# Patient Record
Sex: Male | Born: 1985 | Race: Black or African American | Hispanic: No | Marital: Single | Smoking: Current every day smoker
Health system: Southern US, Community
[De-identification: ages and names within clinical notes are randomized; demographics above are authoritative.]

## PROBLEM LIST (undated history)

## (undated) DIAGNOSIS — R569 Unspecified convulsions: Secondary | ICD-10-CM

## (undated) DIAGNOSIS — J45909 Unspecified asthma, uncomplicated: Secondary | ICD-10-CM

---
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
## 2004-01-21 ENCOUNTER — Emergency Department (HOSPITAL_COMMUNITY): Admission: EM | Admit: 2004-01-21 | Discharge: 2004-01-21 | Payer: Self-pay | Admitting: Family Medicine

## 2004-10-23 ENCOUNTER — Emergency Department (HOSPITAL_COMMUNITY): Admission: EM | Admit: 2004-10-23 | Discharge: 2004-10-23 | Payer: Self-pay | Admitting: Emergency Medicine

## 2004-10-31 ENCOUNTER — Emergency Department (HOSPITAL_COMMUNITY): Admission: EM | Admit: 2004-10-31 | Discharge: 2004-10-31 | Payer: Self-pay | Admitting: Family Medicine

## 2004-12-13 ENCOUNTER — Emergency Department (HOSPITAL_COMMUNITY): Admission: EM | Admit: 2004-12-13 | Discharge: 2004-12-13 | Payer: Self-pay | Admitting: Emergency Medicine

## 2004-12-19 ENCOUNTER — Ambulatory Visit: Payer: Self-pay | Admitting: Internal Medicine

## 2004-12-23 ENCOUNTER — Ambulatory Visit (HOSPITAL_COMMUNITY): Admission: RE | Admit: 2004-12-23 | Discharge: 2004-12-23 | Payer: Self-pay | Admitting: Internal Medicine

## 2005-02-17 ENCOUNTER — Ambulatory Visit: Payer: Self-pay | Admitting: Internal Medicine

## 2005-02-27 ENCOUNTER — Ambulatory Visit: Payer: Self-pay | Admitting: Family Medicine

## 2005-03-06 ENCOUNTER — Ambulatory Visit: Payer: Self-pay | Admitting: Internal Medicine

## 2005-03-09 ENCOUNTER — Ambulatory Visit: Payer: Self-pay | Admitting: *Deleted

## 2005-03-17 ENCOUNTER — Ambulatory Visit: Payer: Self-pay | Admitting: Internal Medicine

## 2005-08-28 ENCOUNTER — Ambulatory Visit: Payer: Self-pay | Admitting: Internal Medicine

## 2005-12-16 ENCOUNTER — Emergency Department (HOSPITAL_COMMUNITY): Admission: EM | Admit: 2005-12-16 | Discharge: 2005-12-17 | Payer: Self-pay | Admitting: Emergency Medicine

## 2006-04-27 ENCOUNTER — Emergency Department (HOSPITAL_COMMUNITY): Admission: EM | Admit: 2006-04-27 | Discharge: 2006-04-27 | Payer: Self-pay | Admitting: Emergency Medicine

## 2007-08-14 ENCOUNTER — Emergency Department (HOSPITAL_COMMUNITY): Admission: EM | Admit: 2007-08-14 | Discharge: 2007-08-14 | Payer: Self-pay | Admitting: Emergency Medicine

## 2007-08-22 ENCOUNTER — Ambulatory Visit: Payer: Self-pay | Admitting: Nurse Practitioner

## 2007-08-22 DIAGNOSIS — G40909 Epilepsy, unspecified, not intractable, without status epilepticus: Secondary | ICD-10-CM

## 2007-09-05 ENCOUNTER — Encounter (INDEPENDENT_AMBULATORY_CARE_PROVIDER_SITE_OTHER): Payer: Self-pay | Admitting: Nurse Practitioner

## 2007-09-06 ENCOUNTER — Ambulatory Visit: Payer: Self-pay | Admitting: Pulmonary Disease

## 2007-09-08 ENCOUNTER — Ambulatory Visit: Payer: Self-pay | Admitting: Nurse Practitioner

## 2007-09-14 LAB — CONVERTED CEMR LAB
ALT: 64 units/L — ABNORMAL HIGH (ref 0–53)
Albumin: 4.7 g/dL (ref 3.5–5.2)
Alkaline Phosphatase: 135 units/L — ABNORMAL HIGH (ref 39–117)
Basophils Absolute: 0 10*3/uL (ref 0.0–0.1)
Eosinophils Absolute: 0.4 10*3/uL (ref 0.0–0.7)
Glucose, Bld: 84 mg/dL (ref 70–99)
Lymphocytes Relative: 30 % (ref 12–46)
Lymphs Abs: 1.7 10*3/uL (ref 0.7–4.0)
MCV: 90 fL (ref 78.0–100.0)
Neutrophils Relative %: 53 % (ref 43–77)
Platelets: 383 10*3/uL (ref 150–400)
Potassium: 4.5 meq/L (ref 3.5–5.3)
RDW: 13 % (ref 11.5–15.5)
Sodium: 139 meq/L (ref 135–145)
Total Protein: 8.2 g/dL (ref 6.0–8.3)
WBC: 5.9 10*3/uL (ref 4.0–10.5)

## 2007-09-15 ENCOUNTER — Encounter (INDEPENDENT_AMBULATORY_CARE_PROVIDER_SITE_OTHER): Payer: Self-pay | Admitting: Nurse Practitioner

## 2007-09-22 ENCOUNTER — Telehealth (INDEPENDENT_AMBULATORY_CARE_PROVIDER_SITE_OTHER): Payer: Self-pay | Admitting: Nurse Practitioner

## 2007-09-23 ENCOUNTER — Telehealth (INDEPENDENT_AMBULATORY_CARE_PROVIDER_SITE_OTHER): Payer: Self-pay | Admitting: Nurse Practitioner

## 2007-09-27 ENCOUNTER — Ambulatory Visit: Payer: Self-pay | Admitting: Nurse Practitioner

## 2007-09-28 LAB — CONVERTED CEMR LAB: Phenytoin Lvl: 10.3 ug/mL (ref 10.0–20.0)

## 2007-09-29 ENCOUNTER — Encounter (INDEPENDENT_AMBULATORY_CARE_PROVIDER_SITE_OTHER): Payer: Self-pay | Admitting: Nurse Practitioner

## 2007-11-17 ENCOUNTER — Emergency Department (HOSPITAL_COMMUNITY): Admission: EM | Admit: 2007-11-17 | Discharge: 2007-11-17 | Payer: Self-pay | Admitting: Emergency Medicine

## 2007-12-27 ENCOUNTER — Encounter (INDEPENDENT_AMBULATORY_CARE_PROVIDER_SITE_OTHER): Payer: Self-pay | Admitting: Nurse Practitioner

## 2008-01-16 ENCOUNTER — Ambulatory Visit: Payer: Self-pay | Admitting: Family Medicine

## 2008-01-16 DIAGNOSIS — L738 Other specified follicular disorders: Secondary | ICD-10-CM | POA: Insufficient documentation

## 2008-02-10 ENCOUNTER — Ambulatory Visit: Payer: Self-pay | Admitting: Nurse Practitioner

## 2008-02-13 ENCOUNTER — Encounter (INDEPENDENT_AMBULATORY_CARE_PROVIDER_SITE_OTHER): Payer: Self-pay | Admitting: Nurse Practitioner

## 2008-07-03 ENCOUNTER — Encounter (INDEPENDENT_AMBULATORY_CARE_PROVIDER_SITE_OTHER): Payer: Self-pay | Admitting: Family Medicine

## 2008-07-03 ENCOUNTER — Ambulatory Visit: Payer: Self-pay | Admitting: Nurse Practitioner

## 2008-07-03 DIAGNOSIS — J45909 Unspecified asthma, uncomplicated: Secondary | ICD-10-CM | POA: Insufficient documentation

## 2010-04-15 ENCOUNTER — Emergency Department (HOSPITAL_COMMUNITY): Admission: EM | Admit: 2010-04-15 | Discharge: 2010-04-15 | Payer: Self-pay | Admitting: Emergency Medicine

## 2010-04-16 ENCOUNTER — Emergency Department (HOSPITAL_COMMUNITY)
Admission: EM | Admit: 2010-04-16 | Discharge: 2010-04-16 | Payer: Self-pay | Source: Home / Self Care | Admitting: Family Medicine

## 2011-02-23 LAB — RAPID URINE DRUG SCREEN, HOSP PERFORMED
Amphetamines: NOT DETECTED
Barbiturates: NOT DETECTED
Benzodiazepines: NOT DETECTED
Cocaine: NOT DETECTED
Opiates: NOT DETECTED
Tetrahydrocannabinol: NOT DETECTED

## 2011-02-26 LAB — PHENYTOIN LEVEL, TOTAL: Phenytoin Lvl: <2.5 — ABNORMAL LOW

## 2011-05-18 ENCOUNTER — Emergency Department (HOSPITAL_COMMUNITY)
Admission: EM | Admit: 2011-05-18 | Discharge: 2011-05-19 | Discharge status: Against Medical Advice | Payer: BC Managed Care – PPO | Attending: Emergency Medicine | Admitting: Emergency Medicine

## 2011-05-18 ENCOUNTER — Encounter: Payer: Self-pay | Admitting: *Deleted

## 2011-05-18 DIAGNOSIS — M79609 Pain in unspecified limb: Secondary | ICD-10-CM | POA: Insufficient documentation

## 2011-05-18 DIAGNOSIS — R109 Unspecified abdominal pain: Secondary | ICD-10-CM | POA: Insufficient documentation

## 2011-05-18 DIAGNOSIS — R42 Dizziness and giddiness: Secondary | ICD-10-CM | POA: Insufficient documentation

## 2011-05-18 HISTORY — DX: Unspecified convulsions: R56.9

## 2011-05-18 NOTE — ED Notes (Addendum)
Dizzy at work today, comes and goes, abd pain onset tonight, comes and goes, "like a knot", describes as intermittant and severe, (denies: nvd, fever or other sx), rates pain now at 4/10. occurrs every 30 minutes, then just stops. Legs have been hurting today also.

## 2011-05-19 LAB — DIFFERENTIAL
Basophils Absolute: 0 10*3/uL (ref 0.0–0.1)
Basophils Relative: 1 % (ref 0–1)
Lymphocytes Relative: 26 % (ref 12–46)
Neutro Abs: 5.3 10*3/uL (ref 1.7–7.7)

## 2011-05-19 LAB — CBC
MCHC: 34 g/dL (ref 30.0–36.0)
Platelets: 336 10*3/uL (ref 150–400)
RDW: 12.8 % (ref 11.5–15.5)
WBC: 8.9 10*3/uL (ref 4.0–10.5)

## 2011-05-19 LAB — BASIC METABOLIC PANEL
BUN: 12 mg/dL (ref 6–23)
Calcium: 9.4 mg/dL (ref 8.4–10.5)
Chloride: 107 mEq/L (ref 96–112)
Creatinine, Ser: 0.78 mg/dL (ref 0.50–1.35)
GFR calc Af Amer: >90 mL/min (ref >90)
GFR calc non Af Amer: >90 mL/min (ref >90)

## 2011-05-19 NOTE — ED Notes (Signed)
Called patient 3 times and NO ANSWER. 

## 2011-05-22 ENCOUNTER — Emergency Department (HOSPITAL_COMMUNITY)
Admission: EM | Admit: 2011-05-22 | Discharge: 2011-05-23 | Disposition: A | Discharge status: Home / Self Care | Payer: BC Managed Care – PPO | Attending: Emergency Medicine | Admitting: Emergency Medicine

## 2011-05-22 ENCOUNTER — Encounter (HOSPITAL_COMMUNITY): Payer: Self-pay | Admitting: *Deleted

## 2011-05-22 DIAGNOSIS — A084 Viral intestinal infection, unspecified: Secondary | ICD-10-CM

## 2011-05-22 DIAGNOSIS — G40909 Epilepsy, unspecified, not intractable, without status epilepticus: Secondary | ICD-10-CM | POA: Insufficient documentation

## 2011-05-22 DIAGNOSIS — A088 Other specified intestinal infections: Secondary | ICD-10-CM | POA: Insufficient documentation

## 2011-05-22 DIAGNOSIS — R197 Diarrhea, unspecified: Secondary | ICD-10-CM | POA: Insufficient documentation

## 2011-05-22 DIAGNOSIS — R110 Nausea: Secondary | ICD-10-CM | POA: Insufficient documentation

## 2011-05-22 DIAGNOSIS — R1033 Periumbilical pain: Secondary | ICD-10-CM | POA: Insufficient documentation

## 2011-05-22 LAB — COMPREHENSIVE METABOLIC PANEL
AST: 19 U/L (ref 0–37)
Albumin: 4.1 g/dL (ref 3.5–5.2)
Alkaline Phosphatase: 103 U/L (ref 39–117)
CO2: 23 mEq/L (ref 19–32)
Chloride: 101 mEq/L (ref 96–112)
Creatinine, Ser: 0.83 mg/dL (ref 0.50–1.35)
GFR calc non Af Amer: >90 mL/min (ref >90)
Potassium: 3.9 mEq/L (ref 3.5–5.1)
Total Bilirubin: 0.3 mg/dL (ref 0.3–1.2)

## 2011-05-22 LAB — DIFFERENTIAL
Basophils Absolute: 0.1 10*3/uL (ref 0.0–0.1)
Basophils Relative: 1 % (ref 0–1)
Lymphocytes Relative: 27 % (ref 12–46)
Monocytes Absolute: 0.8 10*3/uL (ref 0.1–1.0)
Monocytes Relative: 9 % (ref 3–12)
Neutro Abs: 5.1 10*3/uL (ref 1.7–7.7)
Neutrophils Relative %: 54 % (ref 43–77)

## 2011-05-22 LAB — CBC
HCT: 46 % (ref 39.0–52.0)
Hemoglobin: 16.1 g/dL (ref 13.0–17.0)
MCHC: 35 g/dL (ref 30.0–36.0)
RDW: 12.5 % (ref 11.5–15.5)
WBC: 9.4 10*3/uL (ref 4.0–10.5)

## 2011-05-22 MED ORDER — OXYCODONE-ACETAMINOPHEN 5-325 MG PO TABS
2.0000 | ORAL_TABLET | Freq: Once | ORAL | Status: AC
  Administered 2011-05-22: 2 via ORAL
  Filled 2011-05-22: qty 2

## 2011-05-22 MED ORDER — GI COCKTAIL ~~LOC~~
30.0000 mL | Freq: Once | ORAL | Status: AC
  Administered 2011-05-22: 30 mL via ORAL
  Filled 2011-05-22: qty 30

## 2011-05-22 MED ORDER — ONDANSETRON 4 MG PO TBDP
8.0000 mg | ORAL_TABLET | Freq: Once | ORAL | Status: AC
  Administered 2011-05-22: 8 mg via ORAL
  Filled 2011-05-22: qty 2

## 2011-05-22 NOTE — ED Notes (Addendum)
C/o abd pain, was here 2d ago, eloped at that time. "felt fine yesterday, but pain came back today". Took pepto bismol and "something else" ("not sure what it was, promethazine or something like that"), rates pain 5/10. Pinpoints pain to above umbilicus. Also some nausea & diarrhea, (denies: vomiting or fever), last BM 1 hr ago (diarrhea/water & soft). Labs on the 18th reviewed/unremarkable.

## 2011-05-23 LAB — URINALYSIS, ROUTINE W REFLEX MICROSCOPIC
Bilirubin Urine: NEGATIVE
Glucose, UA: NEGATIVE mg/dL
Hgb urine dipstick: NEGATIVE
Ketones, ur: NEGATIVE mg/dL
Protein, ur: NEGATIVE mg/dL
pH: 6 (ref 5.0–8.0)

## 2011-05-23 MED ORDER — ONDANSETRON 8 MG PO TBDP: 8.0000 mg | ORAL_TABLET | Freq: Three times a day (TID) | ORAL | Status: AC | PRN

## 2011-05-23 MED ORDER — OXYCODONE-ACETAMINOPHEN 5-325 MG PO TABS: 2.0000 | ORAL_TABLET | ORAL | Status: AC | PRN

## 2011-05-23 NOTE — ED Notes (Signed)
Rx given x2 D/c instructions reviewed w/ pt - pt denies any further questions or concerns at present.   

## 2011-05-23 NOTE — Progress Notes (Signed)
Patient was received from Dr. Doylene Canard with disposition pending urinalysis. Urinalysis has come back normal and the patient is discharged with instructions that if the left by Dr. Doylene Canard.

## 2011-06-03 NOTE — ED Provider Notes (Signed)
History     CSN: [id number]  Arrival date & time 05/22/11  4098   First MD Initiated Contact with Patient 05/22/11 2215      Chief Complaint  Patient presents with  . Abdominal Pain    (Consider location/radiation/quality/duration/timing/severity/associated sxs/prior treatment) Patient is a 26 y.o. male presenting with abdominal pain. The history is provided by the patient.  Abdominal Pain The primary symptoms of the illness include abdominal pain, nausea and diarrhea. The primary symptoms of the illness do not include fever, fatigue, shortness of breath, vomiting, hematemesis, hematochezia or dysuria. The current episode started more than 2 days ago. The onset of the illness was gradual. Progression since onset: Waxing and waning, worsening.  The abdominal pain began more than 2 days ago. The pain came on gradually. Progression: Waxing and waning, worsening. The abdominal pain is located in the periumbilical region. The abdominal pain does not radiate. The severity of the abdominal pain is 5/10. The abdominal pain is relieved by nothing. The abdominal pain is exacerbated by eating.  Symptoms associated with the illness do not include chills, anorexia, diaphoresis, heartburn, constipation, urgency, hematuria, frequency or back pain.    Past Medical History  Diagnosis Date  . Seizures     History reviewed. No pertinent past surgical history.  History reviewed. No pertinent family history.  History  Substance Use Topics  . Smoking status: Current Everyday Smoker -- 1.0 packs/day  . Smokeless tobacco: Not on file  . Alcohol Use: Yes      Review of Systems  Unable to perform ROS Constitutional: Positive for appetite change. Negative for fever, chills, diaphoresis, activity change, fatigue and unexpected weight change.  HENT: Negative for ear pain, congestion, sore throat, rhinorrhea, mouth sores, trouble swallowing, neck pain, neck stiffness and postnasal drip.   Eyes:  Negative.   Respiratory: Negative for cough, chest tightness, shortness of breath and wheezing.   Cardiovascular: Negative for chest pain, palpitations and leg swelling.  Gastrointestinal: Positive for nausea, abdominal pain and diarrhea. Negative for heartburn, vomiting, constipation, blood in stool, hematochezia, abdominal distention, anal bleeding, rectal pain, anorexia and hematemesis.  Genitourinary: Negative for dysuria, urgency, frequency, hematuria and flank pain.  Musculoskeletal: Negative for myalgias, back pain and arthralgias.  Skin: Negative for color change, pallor, rash and wound.  Neurological: Negative for dizziness, syncope, weakness, light-headedness and headaches.  Hematological: Negative for adenopathy.  Psychiatric/Behavioral: Negative.     Allergies  Chocolate flavor; Eggs or egg-derived products; and Peanut-containing drug products  Home Medications   Current Outpatient Rx  Name Route Sig Dispense Refill  . BISMUTH SUBSALICYLATE 262 MG/15ML PO SUSP Oral Take 30 mLs by mouth every 6 (six) hours as needed. For upset stomach     . PROCHLORPERAZINE MALEATE 5 MG PO TABS Oral Take 5 mg by mouth every 6 (six) hours as needed. For nausea     . ALBUTEROL SULFATE HFA 108 (90 BASE) MCG/ACT IN AERS Inhalation Inhale 2 puffs into the lungs every 6 (six) hours as needed. For shortness of breath     . OXYCODONE-ACETAMINOPHEN 5-325 MG PO TABS Oral Take 2 tablets by mouth every 4 (four) hours as needed for pain. 20 tablet 0    BP 123/82  Pulse 80  Temp(Src) 97.2 F (36.2 C) (Oral)  Resp 16  SpO2 97%  Physical Exam  Nursing note and vitals reviewed. Constitutional: He is oriented to person, place, and time. He appears well-developed and well-nourished. He is active.  Non-toxic appearance. He does not  have a sickly appearance. He does not appear ill. He appears distressed.  HENT:  Head: Normocephalic and atraumatic.  Right Ear: Hearing, tympanic membrane, external ear and  ear canal normal.  Left Ear: Hearing, tympanic membrane, external ear and ear canal normal.  Nose: Nose normal. No mucosal edema.  Mouth/Throat: Uvula is midline and oropharynx is clear and moist. Mucous membranes are dry. No oral lesions. No uvula swelling. No oropharyngeal exudate, posterior oropharyngeal edema, posterior oropharyngeal erythema or tonsillar abscesses.  Eyes: Conjunctivae and EOM are normal. Pupils are equal, round, and reactive to light. Right eye exhibits no chemosis, no discharge and no exudate. Left eye exhibits no chemosis, no discharge and no exudate. Right conjunctiva is not injected. Left conjunctiva is not injected. No scleral icterus.  Neck: Normal range of motion, full passive range of motion without pain and phonation normal. Neck supple. No rigidity. No Brudzinski's sign noted.  Cardiovascular: Normal rate, regular rhythm, intact distal pulses and normal pulses.   No extrasystoles are present.  Pulmonary/Chest: Effort normal and breath sounds normal. No accessory muscle usage. Not tachypneic. No respiratory distress. He has no decreased breath sounds. He has no wheezes. He has no rhonchi. He has no rales. He exhibits no tenderness, no crepitus and no retraction.  Abdominal: Soft. Normal appearance. He exhibits no shifting dullness, no distension, no pulsatile liver, no fluid wave, no abdominal bruit, no ascites, no pulsatile midline mass and no mass. Bowel sounds are increased. There is no hepatosplenomegaly. There is generalized tenderness. There is no rigidity, no rebound and no guarding. No hernia.  Musculoskeletal: Normal range of motion.  Neurological: He is alert and oriented to person, place, and time. He has normal strength and normal reflexes. He is not disoriented. No cranial nerve deficit. Coordination normal. GCS eye subscore is 4. GCS verbal subscore is 5. GCS motor subscore is 6.  Skin: Skin is warm, dry and intact. No bruising, no ecchymosis, no lesion and no  rash noted. He is not diaphoretic. No erythema. No pallor.  Psychiatric: He has a normal mood and affect. His speech is normal and behavior is normal. Judgment and thought content normal. Cognition and memory are normal.    ED Course  Procedures (including critical care time)  Labs Reviewed  DIFFERENTIAL - Abnormal; Notable for the following:    Eosinophils Relative 10 (*)    Eosinophils Absolute 1.0 (*)    All other components within normal limits  CBC  COMPREHENSIVE METABOLIC PANEL  LIPASE, BLOOD  URINALYSIS, ROUTINE W REFLEX MICROSCOPIC  LAB REPORT - SCANNED   No results found.   1. Abdominal pain   2. Nausea   3. Diarrhea   4. Viral gastroenteritis       MDM  Gastritis, peptic ulcer disease, biliary colic, cholelithiasis, cholecystitis, cholangitis, hepatitis, renal colic, urinary tract infection, colitis, constipation, gastroenteritis all considered among other etiologies in the patient's differential diagnosis.         Felisa Bonier, MD 06/03/11 [phone]

## 2011-07-10 ENCOUNTER — Emergency Department (HOSPITAL_COMMUNITY)
Admission: EM | Admit: 2011-07-10 | Discharge: 2011-07-11 | Disposition: A | Discharge status: Home / Self Care | Payer: BC Managed Care – PPO | Attending: Emergency Medicine | Admitting: Emergency Medicine

## 2011-07-10 DIAGNOSIS — S0181XA Laceration without foreign body of other part of head, initial encounter: Secondary | ICD-10-CM

## 2011-07-10 DIAGNOSIS — S0180XA Unspecified open wound of other part of head, initial encounter: Secondary | ICD-10-CM | POA: Insufficient documentation

## 2011-07-10 DIAGNOSIS — Y92410 Unspecified street and highway as the place of occurrence of the external cause: Secondary | ICD-10-CM | POA: Insufficient documentation

## 2011-07-10 DIAGNOSIS — J45909 Unspecified asthma, uncomplicated: Secondary | ICD-10-CM | POA: Insufficient documentation

## 2011-07-10 DIAGNOSIS — Y999 Unspecified external cause status: Secondary | ICD-10-CM | POA: Insufficient documentation

## 2011-07-10 DIAGNOSIS — Z79899 Other long term (current) drug therapy: Secondary | ICD-10-CM | POA: Insufficient documentation

## 2011-07-10 DIAGNOSIS — S0083XA Contusion of other part of head, initial encounter: Secondary | ICD-10-CM

## 2011-07-10 DIAGNOSIS — F10929 Alcohol use, unspecified with intoxication, unspecified: Secondary | ICD-10-CM

## 2011-07-10 DIAGNOSIS — F17200 Nicotine dependence, unspecified, uncomplicated: Secondary | ICD-10-CM | POA: Insufficient documentation

## 2011-07-11 ENCOUNTER — Emergency Department (HOSPITAL_COMMUNITY): Payer: BC Managed Care – PPO

## 2011-07-11 ENCOUNTER — Encounter (HOSPITAL_COMMUNITY): Payer: Self-pay | Admitting: *Deleted

## 2011-07-11 LAB — URINALYSIS, MICROSCOPIC ONLY
Glucose, UA: NEGATIVE mg/dL
Specific Gravity, Urine: 1.007 (ref 1.005–1.030)
Urobilinogen, UA: 0.2 mg/dL (ref 0.0–1.0)
pH: 6 (ref 5.0–8.0)

## 2011-07-11 LAB — COMPREHENSIVE METABOLIC PANEL
ALT: 56 U/L — ABNORMAL HIGH (ref 0–53)
AST: 62 U/L — ABNORMAL HIGH (ref 0–37)
Alkaline Phosphatase: 102 U/L (ref 39–117)
CO2: 20 mEq/L (ref 19–32)
Chloride: 103 mEq/L (ref 96–112)
GFR calc non Af Amer: >90 mL/min (ref >90)
Potassium: 3.8 mEq/L (ref 3.5–5.1)
Sodium: 137 mEq/L (ref 135–145)
Total Bilirubin: 0.2 mg/dL — ABNORMAL LOW (ref 0.3–1.2)

## 2011-07-11 LAB — CBC
HCT: 44.1 % (ref 39.0–52.0)
Hemoglobin: 15.5 g/dL (ref 13.0–17.0)
MCV: 88.2 fL (ref 78.0–100.0)
RDW: 12.7 % (ref 11.5–15.5)

## 2011-07-11 LAB — LACTIC ACID, PLASMA: Lactic Acid, Venous: 1.7 mmol/L (ref 0.5–2.2)

## 2011-07-11 LAB — POCT I-STAT, CHEM 8
BUN: 13 mg/dL (ref 6–23)
Chloride: 110 mEq/L (ref 96–112)
Glucose, Bld: 99 mg/dL (ref 70–99)
HCT: 47 % (ref 39.0–52.0)
Potassium: 3.8 mEq/L (ref 3.5–5.1)

## 2011-07-11 LAB — SAMPLE TO BLOOD BANK

## 2011-07-11 LAB — ETHANOL: Alcohol, Ethyl (B): 169 mg/dL — ABNORMAL HIGH (ref 0–11)

## 2011-07-11 MED ORDER — SODIUM CHLORIDE 0.9 % IV BOLUS (SEPSIS): 1000.0000 mL | Freq: Once | INTRAVENOUS | Status: DC

## 2011-07-11 MED ORDER — BACITRACIN 500 UNIT/GM EX OINT
1.0000 "application " | TOPICAL_OINTMENT | CUTANEOUS | Status: AC
  Administered 2011-07-11: 1 via TOPICAL
  Filled 2011-07-11: qty 0.9

## 2011-07-11 MED ORDER — TETANUS-DIPHTH-ACELL PERTUSSIS 5-2.5-18.5 LF-MCG/0.5 IM SUSP
0.5000 mL | Freq: Once | INTRAMUSCULAR | Status: AC
  Administered 2011-07-11: 0.5 mL via INTRAMUSCULAR
  Filled 2011-07-11: qty 0.5

## 2011-07-11 MED ORDER — HYDROCODONE-ACETAMINOPHEN 5-325 MG PO TABS: 1.0000 | ORAL_TABLET | Freq: Four times a day (QID) | ORAL | Status: AC | PRN
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
## 2011-07-11 NOTE — ED Notes (Addendum)
LS CTA, MAEx4, abd soft NT, no crepitus, CMS intact, polite, cooperative, follows commands, smells of ETOH. H/o sz, unsure of last sz.
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
## 2011-07-19 ENCOUNTER — Encounter (HOSPITAL_COMMUNITY): Payer: Self-pay | Admitting: *Deleted

## 2011-07-19 ENCOUNTER — Emergency Department (HOSPITAL_COMMUNITY)
Admission: EM | Admit: 2011-07-19 | Discharge: 2011-07-19 | Disposition: A | Discharge status: Home / Self Care | Payer: BC Managed Care – PPO | Attending: Emergency Medicine | Admitting: Emergency Medicine

## 2011-07-19 DIAGNOSIS — Z4802 Encounter for removal of sutures: Secondary | ICD-10-CM | POA: Insufficient documentation

## 2011-07-19 NOTE — ED Notes (Signed)
Here for suture removal, several stitches noted to bilateral eyebrows, no other complaints.

## 2011-07-19 NOTE — ED Provider Notes (Signed)
History     CSN: [id number]  Arrival date & time 07/19/11  1327   First MD Initiated Contact with Patient 07/19/11 1350      Chief Complaint  Patient presents with  . Wound Check    (Consider location/radiation/quality/duration/timing/severity/associated sxs/prior treatment) HPI  Past Medical History  Diagnosis Date  . Seizures     History reviewed. No pertinent past surgical history.  History reviewed. No pertinent family history.  History  Substance Use Topics  . Smoking status: Current Everyday Smoker -- 1.0 packs/day  . Smokeless tobacco: Not on file  . Alcohol Use: Yes      Review of Systems  All other systems reviewed and are negative.    Allergies  Chocolate flavor; Eggs or egg-derived products; and Peanut-containing drug products  Home Medications   Current Outpatient Rx  Name Route Sig Dispense Refill  . HYDROCODONE-ACETAMINOPHEN 5-325 MG PO TABS Oral Take 1-2 tablets by mouth every 6 (six) hours as needed for pain. 20 tablet 0  . ALBUTEROL SULFATE HFA 108 (90 BASE) MCG/ACT IN AERS Inhalation Inhale 2 puffs into the lungs every 6 (six) hours as needed. For shortness of breath       BP 121/78  Pulse 89  Temp(Src) 98.5 F (36.9 C) (Oral)  Resp 18  SpO2 100%  Physical Exam  Nursing note and vitals reviewed. Constitutional: He is oriented to person, place, and time. He appears well-developed and well-nourished.  HENT:  Head: Normocephalic.       Sutures in place and bilateral eyebrows as well as central forehead.  There is no fluctuance or erythema.  There is no drainage from the wounds.  The suture line is intact.  The scar is forming  Eyes: EOM are normal.  Neck: Normal range of motion.  Pulmonary/Chest: Effort normal.  Musculoskeletal: Normal range of motion.  Neurological: He is alert and oriented to person, place, and time.  Psychiatric: He has a normal mood and affect.    ED Course  Procedures (including critical care time)  SUTURE  REMOVAL Performed by: Lyanne Co  Consent: Verbal consent obtained. Patient identity confirmed: provided demographic data Time out: Immediately prior to procedure a "time out" was called to verify the correct patient, procedure, equipment, support staff and site/side marked as required.  Location: Left eyebrow right eyebrow and forehead  Wound Appearance: clean  Sutures/Staples Removed: 6 simple interrupted and a running   Patient tolerance: Patient tolerated the procedure well with no immediate complications.     Labs Reviewed - No data to display No results found.   1. Visit for suture removal       MDM  Suture removal.  No infection the        Lyanne Co, MD 07/19/11 1404

## 2011-12-22 ENCOUNTER — Emergency Department (INDEPENDENT_AMBULATORY_CARE_PROVIDER_SITE_OTHER)
Admission: EM | Admit: 2011-12-22 | Discharge: 2011-12-22 | Disposition: A | Discharge status: Home / Self Care | Payer: BC Managed Care – PPO | Source: Home / Self Care | Attending: Emergency Medicine | Admitting: Emergency Medicine

## 2011-12-22 ENCOUNTER — Encounter (HOSPITAL_COMMUNITY): Payer: Self-pay

## 2011-12-22 DIAGNOSIS — L259 Unspecified contact dermatitis, unspecified cause: Secondary | ICD-10-CM

## 2011-12-22 DIAGNOSIS — F524 Premature ejaculation: Secondary | ICD-10-CM

## 2011-12-22 HISTORY — DX: Unspecified asthma, uncomplicated: J45.909

## 2011-12-22 MED ORDER — HYDROXYZINE HCL 25 MG PO TABS: 25.0000 mg | ORAL_TABLET | Freq: Four times a day (QID) | ORAL | Status: AC | PRN

## 2011-12-22 MED ORDER — PREDNISONE 10 MG PO TABS: 50.0000 mg | ORAL_TABLET | Freq: Every day | ORAL | Status: DC

## 2011-12-22 NOTE — ED Provider Notes (Signed)
Medical screening examination/treatment/procedure(s) were performed by non-physician practitioner and as supervising physician I was immediately available for consultation/collaboration.  Leslee Home, M.D.   Reuben Likes, MD 12/22/11 2056

## 2011-12-22 NOTE — ED Notes (Signed)
C/o itchy rash to arms, legs, face and neck for 1 week.  States children in his home had the same thing.  Has taken benadryl without relief of sx.  Also c/o problems with premature ejaculation for several months- states has never had this problem before.

## 2011-12-22 NOTE — ED Provider Notes (Signed)
History     CSN: [id number]  Arrival date & time 12/22/11  1627   First MD Initiated Contact with Patient 12/22/11 1811      Chief Complaint  Patient presents with  . Rash  . Premature Ejaculation    (Consider location/radiation/quality/duration/timing/severity/associated sxs/prior treatment) HPI Comments: Pt also c/o early ejaculation for 9 months, so rapid that time from erection to ejaculation is a minute or 2.  Denies increased stress, mental health concerns, no new medicines.   Patient is a 26 y.o. male presenting with rash. The history is provided by the patient.  Rash  This is a new problem. Episode onset: a week ago. The problem has been gradually worsening. The problem is associated with a new detergent/soap. There has been no fever. The rash is present on the face, groin, back, neck, left arm, right arm, right upper leg and left upper leg. The pain is at a severity of 0/10. Associated symptoms include itching. He has tried antihistamines for the symptoms. The treatment provided no relief.    Past Medical History  Diagnosis Date  . Seizures   . Asthma     History reviewed. No pertinent past surgical history.  History reviewed. No pertinent family history.  History  Substance Use Topics  . Smoking status: Current Everyday Smoker -- 1.0 packs/day  . Smokeless tobacco: Not on file  . Alcohol Use: Yes      Review of Systems  Constitutional: Negative for fever and chills.  Genitourinary:       Early ejaculation  Skin: Positive for itching and rash.    Allergies  Chocolate flavor; Eggs or egg-derived products; and Peanut-containing drug products  Home Medications   Current Outpatient Rx  Name Route Sig Dispense Refill  . ALBUTEROL SULFATE HFA 108 (90 BASE) MCG/ACT IN AERS Inhalation Inhale 2 puffs into the lungs every 6 (six) hours as needed. For shortness of breath     . HYDROXYZINE HCL 25 MG PO TABS Oral Take 1 tablet (25 mg total) by mouth every 6 (six)  hours as needed for itching. 16 tablet 0  . PREDNISONE 10 MG PO TABS Oral Take 5 tablets (50 mg total) by mouth daily. 25 tablet 0    BP 138/82  Pulse 90  Temp 98.4 F (36.9 C) (Oral)  Resp 19  SpO2 99%  Physical Exam  Constitutional: He appears well-developed and well-nourished. No distress.  Pulmonary/Chest: Effort normal.  Genitourinary:       Exam deferred to urology.   Skin: Skin is warm, dry and intact. Rash noted. Rash is papular.       Fine papules on BUE, face, neck, upper back, upper chest.  Pt refuses exam of BLE, groin area.     ED Course  Procedures (including critical care time)  Labs Reviewed - No data to display No results found.   1. Contact dermatitis   2. Premature ejaculation       MDM  Pt has stopped using new body wash he thinks is causing rash.  Referred to urology.         Cathlyn Parsons, NP 12/22/11 Rickey Primus

## 2012-03-15 ENCOUNTER — Encounter (HOSPITAL_COMMUNITY): Payer: Self-pay | Admitting: Emergency Medicine

## 2012-03-15 ENCOUNTER — Emergency Department (INDEPENDENT_AMBULATORY_CARE_PROVIDER_SITE_OTHER)
Admission: EM | Admit: 2012-03-15 | Discharge: 2012-03-15 | Disposition: A | Discharge status: Home / Self Care | Payer: BC Managed Care – PPO | Source: Home / Self Care | Attending: Family Medicine | Admitting: Family Medicine

## 2012-03-15 DIAGNOSIS — G40909 Epilepsy, unspecified, not intractable, without status epilepticus: Secondary | ICD-10-CM

## 2012-03-15 LAB — POCT I-STAT, CHEM 8
BUN: 13 mg/dL (ref 6–23)
Chloride: 102 mEq/L (ref 96–112)
Sodium: 143 mEq/L (ref 135–145)
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
## 2012-03-15 NOTE — ED Notes (Signed)
Patient concerned he may have had a seizure.  Concerned he had a seizure during the night.

## 2012-11-30 ENCOUNTER — Encounter (HOSPITAL_COMMUNITY): Payer: Self-pay

## 2012-11-30 ENCOUNTER — Emergency Department (HOSPITAL_COMMUNITY)
Admission: EM | Admit: 2012-11-30 | Discharge: 2012-11-30 | Disposition: A | Discharge status: Home / Self Care | Payer: BC Managed Care – PPO | Attending: Emergency Medicine | Admitting: Emergency Medicine

## 2012-11-30 ENCOUNTER — Ambulatory Visit (INDEPENDENT_AMBULATORY_CARE_PROVIDER_SITE_OTHER): Payer: BC Managed Care – PPO | Admitting: Family Medicine

## 2012-11-30 VITALS — BP 110/74 | HR 91 | Temp 98.5°F | Resp 16 | Ht 67.0 in | Wt 180.6 lb

## 2012-11-30 DIAGNOSIS — R569 Unspecified convulsions: Secondary | ICD-10-CM

## 2012-11-30 DIAGNOSIS — F17200 Nicotine dependence, unspecified, uncomplicated: Secondary | ICD-10-CM | POA: Insufficient documentation

## 2012-11-30 DIAGNOSIS — G40909 Epilepsy, unspecified, not intractable, without status epilepticus: Secondary | ICD-10-CM

## 2012-11-30 DIAGNOSIS — J45909 Unspecified asthma, uncomplicated: Secondary | ICD-10-CM | POA: Insufficient documentation

## 2012-11-30 MED ORDER — LEVETIRACETAM 100 MG/ML PO SOLN: 500.0000 mg | Freq: Two times a day (BID) | ORAL | Status: DC

## 2012-11-30 MED ORDER — SODIUM CHLORIDE 0.9 % IV SOLN
1000.0000 mg | Freq: Once | INTRAVENOUS | Status: AC
  Administered 2012-11-30: 1000 mg via INTRAVENOUS
  Filled 2012-11-30: qty 10

## 2012-11-30 MED ORDER — LEVETIRACETAM 500 MG PO TABS: 500.0000 mg | ORAL_TABLET | Freq: Two times a day (BID) | ORAL | Status: DC

## 2012-11-30 NOTE — ED Provider Notes (Signed)
History    CSN: [id number] Arrival date & time 11/30/12  0151  First MD Initiated Contact with Patient 11/30/12 [phone]     Chief Complaint  Patient presents with  . Seizures   (Consider location/radiation/quality/duration/timing/severity/associated sxs/prior Treatment) HPI History provided by patient and family bedside. Has remote history of seizures and was previously taking Dilantin. He has not had a seizure in "years". Tonight at home his mother heard him make some moaning noises, she went to check on him and noted seizure activity lasting about 5 minutes. He sustained a tongue laceration and had about 10 minutes of post ictal state. Now the emergency department she feels back to himself, he denies any pain or complaints. He denies any headache, weakness or numbness. No recent fevers or chills, cough cold or congestion. No recent illness. He denies any drug or alcohol use. He admits to very poor sleep schedule and typically only gets about 5 hours of sleep per night.  He very much does not want to be back on Dilantin and is requesting another medication if he should need it.  Past Medical History  Diagnosis Date  . Seizures   . Asthma    History reviewed. No pertinent past surgical history. No family history on file. History  Substance Use Topics  . Smoking status: Current Every Day Smoker -- 1.00 packs/day  . Smokeless tobacco: Not on file  . Alcohol Use: Yes    Review of Systems  Constitutional: Negative for fever and chills.  HENT: Negative for neck pain and neck stiffness.   Eyes: Negative for pain.  Respiratory: Negative for shortness of breath.   Cardiovascular: Negative for chest pain.  Gastrointestinal: Negative for abdominal pain.  Genitourinary: Negative for dysuria.  Musculoskeletal: Negative for back pain.  Skin: Negative for rash.  Neurological: Positive for seizures. Negative for headaches.  All other systems reviewed and are negative.    Allergies   Chocolate flavor; Eggs or egg-derived products; and Peanut-containing drug products  Home Medications  No current outpatient prescriptions on file. BP 122/86  Pulse 88  Temp(Src) 98.7 F (37.1 C) (Oral)  Resp 18  SpO2 94% Physical Exam  Constitutional: He is oriented to person, place, and time. He appears well-developed and well-nourished.  HENT:  Head: Normocephalic and atraumatic.  Superficial tongue laceration without dental tenderness or trismus.  Eyes: Conjunctivae and EOM are normal. Pupils are equal, round, and reactive to light.  Neck: Full passive range of motion without pain. Neck supple. No thyromegaly present.  No meningismus. No C-spine tenderness  Cardiovascular: Normal rate, regular rhythm, S1 normal, S2 normal and intact distal pulses.   Pulmonary/Chest: Effort normal and breath sounds normal.  Abdominal: Soft. Bowel sounds are normal. There is no tenderness. There is no CVA tenderness.  Musculoskeletal: Normal range of motion.  Neurological: He is alert and oriented to person, place, and time. He has normal strength and normal reflexes. No cranial nerve deficit or sensory deficit. He displays a negative Romberg sign. GCS eye subscore is 4. GCS verbal subscore is 5. GCS motor subscore is 6.  Skin: Skin is warm and dry. No rash noted. No cyanosis. Nails show no clubbing.  Psychiatric: He has a normal mood and affect. His speech is normal and behavior is normal.    ED Course  Procedures (including critical care time)  IV Keppra provided  Old records reviewed. Urgent care note from October 2013 with questionable seizure activity in the setting of drinking beer - patient was  evaluated by Dr.Kindl at that time  4:45 AM ambulates NAD, tolerates POs, repeat neuro exam unchanged. Pt requesting d/c home.   Seizure precautions provided. Neurology referral. Primary care referral.  Prescription for Keppra. Patient and family state understanding driving and safety precautions  given seizure disorder.  MDM   Witnessed seizure tonight with history of same  IV Keppra load and prescription provided  Vital signs, previous records and nursing notes reviewed and considered  Sunnie Nielsen, MD 11/30/12 [phone]

## 2012-11-30 NOTE — ED Notes (Signed)
Per family, pt was asleep and screamed out. When mother checked on patient, patient was seizing. Reports lasted less than 5 min. Pt. Bit tongue. Alert and oriented currently. Pt. Previously on dilantin for seizures.

## 2012-11-30 NOTE — ED Notes (Signed)
Seizures precautions set up
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
## 2012-11-30 NOTE — Patient Instructions (Addendum)
Try the liquid keppra medication.  Please give your neurologist a call and schedule a follow-up as soon as possible.    [facility] Address: [address], [place], [place] [address] [phone]  If you have any problems please let us know.

## 2012-11-30 NOTE — Progress Notes (Signed)
[facility] [address] [place] [address] [phone]  Date:  11/30/2012   Name:  Theodore Cruz   DOB:  Nov 09, 1985   MRN:  [id number]  PCP:  No PCP Per Patient    Chief Complaint: Seizures   History of Present Illness:  Theodore Cruz is a 27 y.o. very pleasant male patient who presents with the following:  He was in the ER overnight last night due to a seizure.  His mother noted him to be having a seizure which lasted about 5 minutes.  They went to the ED, and he was feeling better and was allowed to go home.  He had a superficial tongue laceration but no other injury  He had been on dilantin in the past.  He was loaded with IV keppra and given an Rx for po Keppra by the ED.  However, he has not yet picked up the keppra rx.    His last seizure was about 2 years ago.  He had stopped taking his dilantin more than a year ago.   He now feels fine.  No headache.  He feels back to normal.     He started having seizures 5 or 6 years ago, and always has them "in his sleep."   He has seen a neurologist in the past.  He is a pt at [facility] neurology but has not been there in a couple of years at least.    Camdin admits that he has not been compliant with his medications in the past.  This is partially due to his not liking to take medications, but also because he has been afraid of swallowing pills.  He thinks he would do better with a liquid medication.    Patient Active Problem List   Diagnosis Date Noted  . ASTHMA, CHILDHOOD 07/03/2008  . FOLLICULITIS 01/16/2008  . SEIZURE DISORDER 08/22/2007    Past Medical History  Diagnosis Date  . Seizures   . Asthma     No past surgical history on file.  History  Substance Use Topics  . Smoking status: Current Every Day Smoker -- 1.00 packs/day  . Smokeless tobacco: Not on file  . Alcohol Use: Yes    Family History  Problem Relation Age of Onset  . Alcohol abuse Paternal Grandfather     Allergies  Allergen  Reactions  . Chocolate Flavor Shortness Of Breath and Swelling  . Eggs Or Egg-Derived Products Shortness Of Breath and Swelling  . Peanut-Containing Drug Products Shortness Of Breath and Swelling    Medication list has been reviewed and updated.  Current Outpatient Prescriptions on File Prior to Visit  Medication Sig Dispense Refill  . levETIRAcetam (KEPPRA) 500 MG tablet Take 1 tablet (500 mg total) by mouth every 12 (twelve) hours.  30 tablet  0   No current facility-administered medications on file prior to visit.    Review of Systems:  As per HPI- otherwise negative.   Physical Examination: Filed Vitals:   11/30/12 1923  BP: 110/74  Pulse: 91  Temp: 98.5 F (36.9 C)  Resp: 16   Filed Vitals:   11/30/12 1923  Height: 5\' 7"  (1.702 m)  Weight: 180 lb 9.6 oz (81.92 kg)   Body mass index is 28.28 kg/(m^2). Ideal Body Weight: Weight in (lb) to have BMI = 25: 159.3  GEN: WDWN, NAD, Non-toxic, A & O x 3, looks well HEENT: Atraumatic, Normocephalic. Neck supple. No masses, No LAD.  PEERL, EOMI Ears  and Nose: No external deformity. CV: RRR, No M/G/R. No JVD. No thrill. No extra heart sounds. PULM: CTA B, no wheezes, crackles, rhonchi. No retractions. No resp. distress. No accessory muscle use. EXTR: No c/c/e NEURO Normal gait. Normal strength, sensation and DTR all extremities, negative romberg.   PSYCH: Normally interactive. Conversant. Not depressed or anxious appearing.  Calm demeanor.    Assessment and Plan: Seizure disorder - Plan: levETIRAcetam (KEPPRA) 100 MG/ML solution  Pt is aware he should not be driving until he is cleared by his neurolgoist.  Exchanged his keppra pills for the liquid form.  Asked him to call his neurologist in the morning and to schedule an appt.  Explained the necessity of compliance with his medication to help prevent future seizures, and so that he can drive again in the future.    Signed Abbe Amsterdam, MD

## 2012-11-30 NOTE — ED Notes (Signed)
Pt. Ambulated in hall with no problems 

## 2013-12-10 ENCOUNTER — Other Ambulatory Visit: Payer: Self-pay | Admitting: Family Medicine

## 2014-01-18 ENCOUNTER — Other Ambulatory Visit: Payer: Self-pay | Admitting: Physician Assistant

## 2014-01-19 ENCOUNTER — Telehealth: Payer: Self-pay | Admitting: Physician Assistant

## 2014-01-19 MED ORDER — LEVETIRACETAM 100 MG/ML PO SOLN: ORAL | Status: DC

## 2014-01-19 NOTE — Telephone Encounter (Signed)
Patient's Keppra was denied.  Chart reviewed. Pharmacy tech will communicate that this is the last Rx without an office visit.  Meds ordered this encounter  Medications  . levETIRAcetam (KEPPRA) 100 MG/ML solution    Sig: TAKE 5 MLS (500 MG TOTAL) BY MOUTH 2 (TWO) TIMES DAILY.Need office visit for additional refills.    Dispense:  300 mL    Refill:  0    Needs ov    Order Specific Question:  Supervising Provider    Answer:  DOOLITTLE, ROBERT P [[id number]]

## 2014-02-15 ENCOUNTER — Other Ambulatory Visit: Payer: Self-pay | Admitting: Physician Assistant

## 2014-03-09 ENCOUNTER — Emergency Department (HOSPITAL_COMMUNITY)
Admission: EM | Admit: 2014-03-09 | Discharge: 2014-03-09 | Disposition: A | Discharge status: Home / Self Care | Payer: BC Managed Care – PPO | Attending: Emergency Medicine | Admitting: Emergency Medicine

## 2014-03-09 ENCOUNTER — Encounter (HOSPITAL_COMMUNITY): Payer: Self-pay | Admitting: Emergency Medicine

## 2014-03-09 DIAGNOSIS — Z720 Tobacco use: Secondary | ICD-10-CM | POA: Diagnosis not present

## 2014-03-09 DIAGNOSIS — J45909 Unspecified asthma, uncomplicated: Secondary | ICD-10-CM | POA: Diagnosis not present

## 2014-03-09 DIAGNOSIS — G40909 Epilepsy, unspecified, not intractable, without status epilepticus: Secondary | ICD-10-CM | POA: Diagnosis not present

## 2014-03-09 DIAGNOSIS — Z760 Encounter for issue of repeat prescription: Secondary | ICD-10-CM | POA: Diagnosis not present

## 2014-03-09 MED ORDER — LEVETIRACETAM 100 MG/ML PO SOLN: 500.0000 mg | Freq: Two times a day (BID) | ORAL | Status: DC

## 2014-03-09 MED ORDER — LEVETIRACETAM 100 MG/ML PO SOLN
500.0000 mg | Freq: Once | ORAL | Status: AC
  Administered 2014-03-09: 500 mg via ORAL
  Filled 2014-03-09: qty 5

## 2014-03-09 MED ORDER — LEVETIRACETAM 100 MG/ML PO SOLN: 500.0000 mg | Freq: Once | ORAL | Status: DC

## 2014-03-09 NOTE — ED Notes (Addendum)
Pt reports unable to get his seizure meds refilled until he gets a "check-up." pts only complaint is "feeling blah." denies any recent seizures. no acute distress noted at triage.

## 2014-03-09 NOTE — Discharge Instructions (Signed)
Please follow up with your primary care physician in 1-2 days. If you do not have one please call the [facility] number listed above. Please take your medications as prescribed. Please read all discharge instructions and return precautions.    Medication Refill, Emergency Department We have refilled your medication today as a courtesy to you. It is best for your medical care, however, to take care of getting refills done through your primary caregiver's office. They have your records and can do a better job of follow-up than we can in the emergency department. On maintenance medications, we often only prescribe enough medications to get you by until you are able to see your regular caregiver. This is a more expensive way to refill medications. In the future, please plan for refills so that you will not have to use the emergency department for this. Thank you for your help. Your help allows us to better take care of the daily emergencies that enter our department. Document Released: 09/04/2003 Document Revised: 08/10/2011 Document Reviewed: 08/25/2013 [facility] Patient Information 2015 [employer], [place]. This information is not intended to replace advice given to you by your health care provider. Make sure you discuss any questions you have with your health care provider.  Establish relationship with primary care doctor as discussed. A resource guide and information on the Affordable Care Act has been provided for your information.    RESOURCE GUIDE  If you do not have a primary care doctor to follow up with regarding today's visit, please call the [facility] at [phone] to make an appointment. Hours of operation are 10am - 7pm, Monday through Friday, and they have a sliding scale fee.   Insufficient Money for Medicine: Contact United Way:  call "211" or [facility] [phone].  No Primary Care Doctor: - Call Health Connect  [phone] - can help  you locate a primary care doctor that  accepts your insurance, provides certain services, etc. - Physician Referral [phone]  Agencies that provide inexpensive medical care: - [facility]  [phone] - [facility]  [phone] - [facility]  [phone] [facility]  [phone] - [facility]  [phone] [facility]  [phone]  Medicaid-accepting [facility] Providers: - [facility]- [address], [address]  [phone], Mon-Fri 9am-7pm, Sat 9am-1pm - [facility]- [address], [address] [place]  [phone] - [facility]- [address], [address] [place]  [phone] [facility]- [address]  [phone] - Renaye RakersVeita Bland- [address], [address], [phone]  Only accepts [place] patients after they have their name  applied to their card  Self Pay (no insurance) in [place]: - Sickle Cell Patients: Dr Willey BladeEric Dean, [facility]  [address], [phone] - [facility]- [address]  [phone]       [facility] G[facility]-     [facility]- [address], [address]       -     [facility]- see information above (Speak to Cit[employer]Pam H if you do not have insurance)       -  [facility]- [address], [phone]       -  [facility]- [address],  [phone]       -  [facility]- [address], [phone]       -  Dr Julio Sickssei-Bonsu-  [address], [address], [place], [phone]       -  [facility]- [address], [phone]       -  [facility]- [address], [phone], also [address], [phone]       -    [facility]- [address], [phone], 1st & 3rd Saturday   every month, 10am-1pm  1) Find a Doctor and Pay Out of Pocket Although you won't have to find out who is  covered by your insurance plan, it is a good idea to ask around and get recommendations. You will then need to call the office and see if the doctor you have chosen will accept you as a new patient and what types of options they offer for patients who are self-pay. Some doctors offer discounts or will set up payment plans for their patients who do not have insurance, but you will need to ask so you aren't surprised when you get to your appointment.  2) Contact Your Local Health Department Not all health departments have doctors that can see patients for sick visits, but many do, so it is worth a call to see if yours does. If you don't know where your local health department is, you can check in your phone book. The CDC also has a tool to help you locate your state's health department, and many state websites also have listings of all of their local health departments.  3) Find a Walk-in Clinic If your illness is not likely to be very severe or complicated, you may want to try a walk in clinic. These are popping up all over the country in pharmacies, drugstores, and shopping centers. They're usually staffed by nurse practitioners or physician assistants that have been trained to treat common illnesses and complaints. They're usually fairly quick and inexpensive. However, if you have serious medical issues or chronic medical problems, these are probably not your best option

## 2014-03-09 NOTE — ED Provider Notes (Signed)
CSN: [id number]     Arrival date & time 03/09/14  [id number] History  This chart was scribed for Theodore DeisJen Norberta Stobaugh, PA-C, working with No att. providers found by Leona CarryG. Clay Sherrill, ED Scribe. The [facility]. The patient's care was started at 7:35 PM.     Chief Complaint  Patient presents with  . Medication Refill   HPI HPI Comments: Theodore FischerJoshua Cruz is a 28 y.o. male with a history of seizures who presents to the Emergency Department for a medication refill. Patient reports mild sleepiness. He [facility] for [facility] "checkup" so that he could get his Keppra prescription refilled. Patient denies fever, chills, SOB, CP, dysuria, urinary frequency, nausea, vomiting, diarrhea.  Patient does not have a PCP.   Past Medical History  Diagnosis Date  . Seizures   . Asthma    History reviewed. No pertinent past surgical history. Family History  Problem Relation Age of Onset  . Alcohol abuse Paternal Grandfather    History  Substance Use Topics  . Smoking status: Current Every Day Smoker -- 1.00 packs/day  . Smokeless tobacco: Not on file  . Alcohol Use: Yes    Review of Systems  Constitutional: Negative for fever and chills.  Respiratory: Negative for shortness of breath.   Cardiovascular: Negative for chest pain.  Gastrointestinal: Negative for nausea, vomiting and diarrhea.  All other systems reviewed and are negative.     Allergies  Chocolate flavor; Eggs or egg-derived products; and Peanut-containing drug products  Home Medications   Prior to Admission medications   Medication Sig Start Date End Date Taking? Authorizing Provider  levETIRAcetam (KEPPRA) 100 MG/ML solution Take 5 mLs (500 mg total) by mouth 2 (two) times daily. TAKE 5 MLS (500 MG TOTAL) BY MOUTH 2 (TWO) TIMES DAILY.Need office visit for additional refills. 03/09/14   [name] L Cebastian Neis, PA-C   Triage Vitals: BP 134/110  Pulse 85  Temp(Src) 98.7 F (37.1 C)  (Oral)  Resp 18  SpO2 100% Physical Exam  Nursing note and vitals reviewed. Constitutional: He is oriented to person, place, and time. He appears well-developed and well-nourished. No distress.  HENT:  Head: Normocephalic and atraumatic.  Right Ear: External ear normal.  Left Ear: External ear normal.  Nose: Nose normal.  Mouth/Throat: Oropharynx is clear and moist. No oropharyngeal exudate.  Eyes: Conjunctivae and EOM are normal. Pupils are equal, round, and reactive to light.  Neck: Normal range of motion. Neck supple.  Cardiovascular: Normal rate, regular rhythm, normal heart sounds and intact distal pulses.   Pulmonary/Chest: Effort normal and breath sounds normal. No respiratory distress.  Abdominal: Soft. There is no tenderness.  Neurological: He is alert and oriented to person, place, and time. He has normal strength. No cranial nerve deficit. Gait normal. GCS eye subscore is 4. GCS verbal subscore is 5. GCS motor subscore is 6.  Sensation grossly intact.  No pronator drift.  Bilateral heel-knee-shin intact.  Skin: Skin is warm and dry. He is not diaphoretic.    ED Course  Procedures (including critical care time) Medications  levETIRAcetam (KEPPRA) 100 MG/ML solution 500 mg (500 mg Oral Given 03/09/14 2029)    DIAGNOSTIC STUDIES: Oxygen Saturation is 100% on room air, normal by my interpretation.    COORDINATION OF CARE: 7:39 PM-Discussed treatment plan which includes Keppra and medication reconciliation pending pharmacist review with pt at bedside and pt agreed to plan.     Labs Review  Labs Reviewed - No data to display  Imaging Review No results found.   EKG Interpretation None      MDM   Final diagnoses:  Encounter for medication refill    Filed Vitals:   03/09/14 1845  BP: 134/110  Pulse: 85  Temp: 98.7 F (37.1 C)  Resp: 18   Afebrile, NAD, non-toxic appearing, AAOx4. Physical examination unremarkable.   1) Medication Refill: Patient given  limited refill on Keppra. Advised he needed to follow up with a PCP or neurologist in the future. Dose given in the ED.   2) HTN: Patient noted to be hypertensive in the emergency department.  No signs of hypertensive urgency.  Discussed with patient the need for close follow-up and management by their primary care physician.      I personally performed the services described in this documentation, which was scribed in my presence. The recorded information has been reviewed and is accurate.     Jeannetta EllisJennifer L Charleston Hankin, PA-C 03/09/14 2247

## 2014-03-10 NOTE — ED Provider Notes (Signed)
Medical screening examination/treatment/procedure(s) were performed by non-physician practitioner and as supervising physician I was immediately available for consultation/collaboration.   EKG Interpretation None       Zyeir Dymek, MD 03/10/14 0015 

## 2014-03-19 ENCOUNTER — Ambulatory Visit (INDEPENDENT_AMBULATORY_CARE_PROVIDER_SITE_OTHER): Payer: BC Managed Care – PPO | Admitting: Family Medicine

## 2014-03-19 VITALS — BP 120/78 | HR 89 | Temp 98.8°F | Resp 18 | Ht 67.0 in | Wt 178.2 lb

## 2014-03-19 DIAGNOSIS — G40909 Epilepsy, unspecified, not intractable, without status epilepticus: Secondary | ICD-10-CM

## 2014-03-19 DIAGNOSIS — J4521 Mild intermittent asthma with (acute) exacerbation: Secondary | ICD-10-CM

## 2014-03-19 MED ORDER — ALBUTEROL SULFATE HFA 108 (90 BASE) MCG/ACT IN AERS: 2.0000 | INHALATION_SPRAY | Freq: Four times a day (QID) | RESPIRATORY_TRACT | Status: DC | PRN

## 2014-03-19 NOTE — Patient Instructions (Signed)
We will get you set up to see neurology to follow-up your seizure disorder.  Also, you can use the albuterol as needed for wheezing.  If you are not able to get into neurology for a little while and need a refill of your keppra please have the pharmacy send me a RF request

## 2014-03-19 NOTE — Progress Notes (Signed)
[facility] [address], [place] [address] [phone]  Date:  03/19/2014   Name:  Theodore FischerJoshua Cruz   DOB:  07/13/1985   MRN:  [id number]  PCP:  No PCP Per Patient    Chief Complaint: other   History of Present Illness:  Theodore Cruz is a 28 y.o. very pleasant male patient who presents with the following:  Here today to follow-up medications. I saw him over a year ago once- I did give him a refill of his keppra and asked him to see his neurologist.  However it appears that he has not done so.  He states he did not understand he was to do this. He has not had a seizure in a year.   He would also like an albuterol inhaler.  He has been borrowing this from a friend and it did help him Patient Active Problem List   Diagnosis Date Noted  . ASTHMA, CHILDHOOD 07/03/2008  . FOLLICULITIS 01/16/2008  . SEIZURE DISORDER 08/22/2007    Past Medical History  Diagnosis Date  . Seizures   . Asthma     History reviewed. No pertinent past surgical history.  History  Substance Use Topics  . Smoking status: Current Every Day Smoker -- 1.00 packs/day  . Smokeless tobacco: Not on file  . Alcohol Use: Yes    Family History  Problem Relation Age of Onset  . Alcohol abuse Paternal Grandfather     Allergies  Allergen Reactions  . Chocolate Flavor Shortness Of Breath and Swelling  . Eggs Or Egg-Derived Products Shortness Of Breath and Swelling  . Peanut-Containing Drug Products Shortness Of Breath and Swelling    Medication list has been reviewed and updated.  Current Outpatient Prescriptions on File Prior to Visit  Medication Sig Dispense Refill  . levETIRAcetam (KEPPRA) 100 MG/ML solution Take 5 mLs (500 mg total) by mouth 2 (two) times daily. TAKE 5 MLS (500 MG TOTAL) BY MOUTH 2 (TWO) TIMES DAILY.Need office visit for additional refills.  500 mL  0   No current facility-administered medications on file prior to visit.    Review of Systems:  As per HPI-  otherwise negative.   Physical Examination: Filed Vitals:   03/19/14 1514  BP: 120/78  Pulse: 89  Temp: 98.8 F (37.1 C)  Resp: 18   Filed Vitals:   03/19/14 1514  Height: 5\' 7"  (1.702 m)  Weight: 178 lb 3.2 oz (80.831 kg)   Body mass index is 27.9 kg/(m^2). Ideal Body Weight: Weight in (lb) to have BMI = 25: 159.3  GEN: WDWN, NAD, Non-toxic, A & O x 3, looks well HEENT: Atraumatic, Normocephalic. Neck supple. No masses, No LAD. Ears and Nose: No external deformity. CV: RRR, No M/G/R. No JVD. No thrill. No extra heart sounds. PULM: CTA B, no crackles, rhonchi. No retractions. No resp. distress. No accessory muscle use.  Mild wheezes bilaterally ABD: S, NT, ND, +BS. No rebound. No HSM. EXTR: No c/c/e NEURO Normal gait.  PSYCH: Normally interactive. Conversant. Not depressed or anxious appearing.  Calm demeanor.    Assessment and Plan: Seizure disorder - Plan: Ambulatory referral to Neurology  Asthma with acute exacerbation, mild intermittent - Plan: albuterol (PROVENTIL HFA;VENTOLIN HFA) 108 (90 BASE) MCG/ACT inhaler  Seizure disorder- he has not seen neurology in some time.  Will make this referral for him.  Explained that I do need him to see a neurologist at least periodically to help us manage his seizures.  Albuterol as needed for asthma.  Checked with pharmD ; there is no significant concern in using this medication in a person with seizures   Signed Abbe AmsterdamJessica Faten Frieson, MD

## 2014-04-14 ENCOUNTER — Other Ambulatory Visit: Payer: Self-pay | Admitting: Physician Assistant

## 2014-04-14 DIAGNOSIS — G40909 Epilepsy, unspecified, not intractable, without status epilepticus: Secondary | ICD-10-CM

## 2014-04-16 NOTE — Telephone Encounter (Signed)
Called and spoke with him.  We did this refill for him.  It seems that [facility]. He was not aware of this.  He will give them a call tomorrow and try to find out the situation.  If he is able to set up a payment plan he can likely just schedule an appt as well.  However if he owes more than he can pay and needs a referral elsewhere he will let me know

## 2014-04-16 NOTE — Telephone Encounter (Signed)
Dr Patsy Lageropland, I read your OV notes and gave pt a RF based on the fact that he hasn't been able to get appt w/neurologist yet. According to Referral notes, referral was made to Dr Pearlean BrownieSethi and his office has been trying to contact pt re: a balance due. Do you want to refer pt somewhere else? Please let Referrals know what you would like them to do.

## 2014-05-23 ENCOUNTER — Other Ambulatory Visit: Payer: Self-pay | Admitting: Family Medicine

## 2014-06-12 ENCOUNTER — Encounter: Payer: Self-pay | Admitting: Family Medicine

## 2014-06-12 ENCOUNTER — Ambulatory Visit (INDEPENDENT_AMBULATORY_CARE_PROVIDER_SITE_OTHER): Payer: BC Managed Care – PPO | Admitting: Family Medicine

## 2014-06-12 VITALS — BP 132/84 | HR 88 | Temp 98.1°F | Ht 67.0 in | Wt 182.0 lb

## 2014-06-12 DIAGNOSIS — G40909 Epilepsy, unspecified, not intractable, without status epilepticus: Secondary | ICD-10-CM | POA: Diagnosis not present

## 2014-06-12 DIAGNOSIS — Z720 Tobacco use: Secondary | ICD-10-CM | POA: Diagnosis not present

## 2014-06-12 NOTE — Assessment & Plan Note (Addendum)
Patient not ready to quit. Cessation counseling given. Informed patient of [facility] clinic [facility]. Instructed patient to call office if he would like help quitting.

## 2014-06-12 NOTE — Assessment & Plan Note (Signed)
Patient currently controlled on keppra daily. Patient reports seeing a neurologist approximately 1 month ago, however it appears as if it was actually an urgent care center. Will continue keppra for now. If patient has breakthrough seizures, will refer to neurologist.

## 2014-06-12 NOTE — Progress Notes (Signed)
Patient ID: Theodore FischerJoshua Cruz, male   DOB: 01/10/1986, 29 y.o.   MRN: [id number]    Theodore Cruz is a 29 y.o. male who [facility]. His concerns today include:  HPI:  Seizure disorder. Patient has a history of seizure disorder and is currently taking keppra daily. Has not had a seizure in 5-6 months. Last seizure was due to running out of medication for a few days. Patient reports that he saw a neurologist approximately 1 month ago.  Tobacco Abuse. Patient is a current everyday smoker. Not currently interested in quitting.    ROS: As per HPI, otherwise all systems reviewed and are negative.  Past Medical History - Reviewed and updated Patient Active Problem List   Diagnosis Date Noted  . Tobacco abuse 06/12/2014  . ASTHMA, CHILDHOOD 07/03/2008  . FOLLICULITIS 01/16/2008  . Seizure disorder 08/22/2007    Medications- reviewed and updated Current Outpatient Prescriptions  Medication Sig Dispense Refill  . albuterol (PROVENTIL HFA;VENTOLIN HFA) 108 (90 BASE) MCG/ACT inhaler Inhale 2 puffs into the lungs every 6 (six) hours as needed for wheezing or shortness of breath. 1 Inhaler 2  . levETIRAcetam (KEPPRA) 100 MG/ML solution TAKE 1 TEASPOONFUL BY MOUTH 2 TIMES A DAY 500 mL 0   No current facility-administered medications for this visit.    Objective: Physical Exam: BP 132/84 mmHg  Pulse 88  Temp(Src) 98.1 F (36.7 C) (Oral)  Ht 5\' 7"  (1.702 m)  Wt 182 lb (82.555 kg)  BMI 28.50 kg/m2  Gen: NAD, resting comfortably CV: RRR with no murmurs appreciated Lungs: NWOB, CTAB with no crackles, wheezes, or rhonchi Abdomen: Normal bowel sounds present. Soft, Nontender, Nondistended. Ext: no edema Skin: warm, dry Neuro: grossly normal, moves all extremities  No results found for this or any previous visit (from the past 72 hour(s)).  A/P: See problem list  Seizure disorder Patient currently controlled on keppra daily. Patient reports seeing a  neurologist approximately 1 month ago, however it appears as if it was actually an urgent care center. Will continue keppra for now. If patient has breakthrough seizures, will refer to neurologist.   Tobacco abuse Patient not ready to quit. Cessation counseling given. Informed patient of cessation clinic [facility]. Instructed patient to call office if he would like help quitting.     No orders of the defined types were placed in this encounter.    No orders of the defined types were placed in this encounter.     Katina Degreealeb M. Jimmey RalphParker, MD [facility] Resident PGY-1 06/12/2014 5:23 PM

## 2014-06-12 NOTE — Patient Instructions (Signed)
Thank you for coming to the clinic today. It was nice seeing you.  For your seizure medications, we can refill them, depending on what your neurologist wants to do.  We also recommend that you quit smoking. We have a lot of options here in the clinic. If you decide you want to try to something, call the office and we can set up an appointment for you.  Otherwise everything looks good.  See you again next year, or sooner if your need anything.

## 2014-06-20 ENCOUNTER — Other Ambulatory Visit: Payer: Self-pay | Admitting: Family Medicine

## 2014-07-23 ENCOUNTER — Other Ambulatory Visit: Payer: Self-pay | Admitting: Family Medicine

## 2014-07-31 ENCOUNTER — Other Ambulatory Visit: Payer: Self-pay | Admitting: Family Medicine

## 2014-08-02 ENCOUNTER — Other Ambulatory Visit: Payer: Self-pay | Admitting: Family Medicine

## 2014-08-17 ENCOUNTER — Other Ambulatory Visit: Payer: Self-pay | Admitting: Family Medicine

## 2014-08-20 ENCOUNTER — Other Ambulatory Visit: Payer: Self-pay | Admitting: Family Medicine

## 2014-08-21 ENCOUNTER — Other Ambulatory Visit: Payer: Self-pay | Admitting: Family Medicine

## 2014-10-10 ENCOUNTER — Other Ambulatory Visit: Payer: Self-pay | Admitting: Family Medicine

## 2014-10-10 NOTE — Telephone Encounter (Signed)
It appears that he owes a balance to [facility].  Called pt and he again stated he was not aware that he was supposed to see a neurologist (although I have now discussed this with him a couple of times) and was not aware that he might owe them money (as a possible barrier to an appt). Again asked him to follow-up with neurology and gave number for [facility] so he can try to resolve this situation.  Did give a one month RF of his medication (keppra)

## 2014-10-10 NOTE — Telephone Encounter (Signed)
Dr Patsy Lageropland, when RF was req'd in March I asked pharm to send to Dr Wilber Oliphantaleb Parker's office since pt had been seen most recently by him for seizures. Dr Parker's office refused to RF and I gave pt 1 RF w/not to RTC for recheck. Do you want to give any more RFs or deny?

## 2014-12-07 ENCOUNTER — Other Ambulatory Visit: Payer: Self-pay | Admitting: Family Medicine

## 2014-12-07 MED ORDER — LEVETIRACETAM 100 MG/ML PO SOLN: ORAL | Status: DC

## 2014-12-07 NOTE — Telephone Encounter (Signed)
Pt calling to check status, this medication is for his seizures, he has an appt with Dr. Nancy MarusMayo next Tuesday but needs enough to get him through. Dr. Jimmey RalphParker is booked until August.

## 2014-12-07 NOTE — Telephone Encounter (Signed)
Mother called becuase her son needs a refill on his Seizure medication. She was driving and not sure what the name of the medication is. She would also like this called in [facility] [place] jw

## 2014-12-11 ENCOUNTER — Ambulatory Visit (INDEPENDENT_AMBULATORY_CARE_PROVIDER_SITE_OTHER): Payer: BLUE CROSS/BLUE SHIELD | Admitting: Internal Medicine

## 2014-12-11 ENCOUNTER — Encounter: Payer: Self-pay | Admitting: Internal Medicine

## 2014-12-11 VITALS — BP 140/84 | HR 87 | Temp 98.0°F | Wt 183.0 lb

## 2014-12-11 DIAGNOSIS — T7840XA Allergy, unspecified, initial encounter: Secondary | ICD-10-CM | POA: Diagnosis not present

## 2014-12-11 DIAGNOSIS — G40909 Epilepsy, unspecified, not intractable, without status epilepticus: Secondary | ICD-10-CM

## 2014-12-11 LAB — CBC WITH DIFFERENTIAL/PLATELET
Basophils Absolute: 0.1 10*3/uL (ref 0.0–0.1)
Basophils Relative: 1 % (ref 0–1)
EOS ABS: 0.5 10*3/uL (ref 0.0–0.7)
EOS PCT: 6 % — AB (ref 0–5)
HEMATOCRIT: 45.5 % (ref 39.0–52.0)
Hemoglobin: 15.5 g/dL (ref 13.0–17.0)
LYMPHS ABS: 2.2 10*3/uL (ref 0.7–4.0)
LYMPHS PCT: 28 % (ref 12–46)
MCH: 30.5 pg (ref 26.0–34.0)
MCHC: 34.1 g/dL (ref 30.0–36.0)
MCV: 89.4 fL (ref 78.0–100.0)
MONO ABS: 0.7 10*3/uL (ref 0.1–1.0)
MPV: 8.8 fL (ref 8.6–12.4)
Monocytes Relative: 9 % (ref 3–12)
Neutro Abs: 4.3 10*3/uL (ref 1.7–7.7)
Neutrophils Relative %: 56 % (ref 43–77)
PLATELETS: 338 10*3/uL (ref 150–400)
RBC: 5.09 MIL/uL (ref 4.22–5.81)
RDW: 13 % (ref 11.5–15.5)
WBC: 7.7 10*3/uL (ref 4.0–10.5)

## 2014-12-11 MED ORDER — LEVETIRACETAM 100 MG/ML PO SOLN: ORAL | Status: DC

## 2014-12-11 NOTE — Progress Notes (Signed)
   Subjective:    Patient ID: Theodore Cruz, male    DOB: 02/26/1986, 29 y.o.   MRN: [id number]  HPI Theodore Cruz is here for a refill of his seizure medication (Keppra). He has had seizures for the last 10 years. They always occur in the middle of the night. They are triggered by stress. His last seizure occurred in 2014. He has been well-controlled on his Keppra for the last 5 years. He denies any side effects from the Keppra such as dizziness, weakness, headache, decreased appetite, or aggressive behavior.  Review of Systems  All other systems reviewed and are negative.  FHx- seizure disorder in great grandfather's brother Social history- current smoker     Objective:   Physical Exam  Constitutional: He is oriented to person, place, and time. He appears well-developed and well-nourished.  HENT:  Head: Normocephalic and atraumatic.  Eyes: Conjunctivae and EOM are normal. Pupils are equal, round, and reactive to light.  Cardiovascular: Normal rate, regular rhythm and normal heart sounds.  Exam reveals no gallop and no friction rub.   No murmur heard. Pulmonary/Chest: Effort normal and breath sounds normal. He has no wheezes. He has no rales.  Neurological: He is alert and oriented to person, place, and time. He has normal strength. No cranial nerve deficit or sensory deficit. Coordination and gait normal.          Assessment & Plan:  See problem based a/p

## 2014-12-11 NOTE — Assessment & Plan Note (Signed)
Pt is here for refill of Keppra. Has been well-controlled on Keppra for the last 5 years. Has not had a seizure since 2014. Denies any side effects from the medication. -Will refill Keppra for the next 6 months -Will order CBC, AST, ALT today, as it is recommended that these be monitored annually for patients on Keppra -Pt to follow-up with PCP in 6 months for regular check-up.

## 2014-12-11 NOTE — Assessment & Plan Note (Signed)
He requests referral to allergist, which I will do today.

## 2014-12-11 NOTE — Patient Instructions (Signed)
It was nice to meet you! Thanks for coming into clinic today.  I have refilled your Keppra today, which should last you 6 months until your next visit with Dr. Jimmey RalphParker  Please let us know if you have any concerns.  -Dr. Nancy MarusMayo

## 2014-12-12 ENCOUNTER — Encounter: Payer: Self-pay | Admitting: Internal Medicine

## 2014-12-12 LAB — ALT: ALT: 25 U/L (ref 0–53)

## 2014-12-12 LAB — AST: AST: 21 U/L (ref 0–37)

## 2014-12-13 ENCOUNTER — Telehealth: Payer: Self-pay | Admitting: Family Medicine

## 2014-12-13 NOTE — Telephone Encounter (Signed)
LM with male to have patient return call. Please advise patient that his allergy appt is scheduled for 12/25/14 @ 9:30am with Dr. Lucie LeatherKozlow  [facility] [address] [place], [place] [address] Office : [phone]  Patient can reschedule appt if this does not work for him.   Letter also mailed.

## 2015-01-04 ENCOUNTER — Other Ambulatory Visit: Payer: Self-pay | Admitting: Family Medicine

## 2015-01-04 ENCOUNTER — Encounter: Payer: Self-pay | Admitting: *Deleted

## 2015-01-04 NOTE — Telephone Encounter (Signed)
Letter mailed to patient to follow up with neuro. Clotilde Loth,CMA

## 2015-01-04 NOTE — Telephone Encounter (Signed)
Patient had this medication filled by Dr Nancy Marus on 7/12 with 6 refills. He will need to follow up with his neurologist for further refills.  Katina Degree. Jimmey Ralph, MD [facility] Resident PGY-2 01/04/2015 1:49 PM

## 2015-03-29 ENCOUNTER — Emergency Department (HOSPITAL_COMMUNITY): Payer: BLUE CROSS/BLUE SHIELD

## 2015-03-29 ENCOUNTER — Encounter (HOSPITAL_COMMUNITY): Payer: Self-pay | Admitting: Emergency Medicine

## 2015-03-29 ENCOUNTER — Emergency Department (HOSPITAL_COMMUNITY)
Admission: EM | Admit: 2015-03-29 | Discharge: 2015-03-29 | Disposition: A | Discharge status: Home / Self Care | Payer: BLUE CROSS/BLUE SHIELD | Attending: Emergency Medicine | Admitting: Emergency Medicine

## 2015-03-29 DIAGNOSIS — Y998 Other external cause status: Secondary | ICD-10-CM | POA: Insufficient documentation

## 2015-03-29 DIAGNOSIS — Y92410 Unspecified street and highway as the place of occurrence of the external cause: Secondary | ICD-10-CM | POA: Diagnosis not present

## 2015-03-29 DIAGNOSIS — S3992XA Unspecified injury of lower back, initial encounter: Secondary | ICD-10-CM | POA: Diagnosis not present

## 2015-03-29 DIAGNOSIS — Z720 Tobacco use: Secondary | ICD-10-CM | POA: Insufficient documentation

## 2015-03-29 DIAGNOSIS — M545 Low back pain: Secondary | ICD-10-CM

## 2015-03-29 DIAGNOSIS — Z79899 Other long term (current) drug therapy: Secondary | ICD-10-CM | POA: Diagnosis not present

## 2015-03-29 DIAGNOSIS — G40909 Epilepsy, unspecified, not intractable, without status epilepticus: Secondary | ICD-10-CM | POA: Insufficient documentation

## 2015-03-29 DIAGNOSIS — Y9389 Activity, other specified: Secondary | ICD-10-CM | POA: Diagnosis not present

## 2015-03-29 DIAGNOSIS — J45909 Unspecified asthma, uncomplicated: Secondary | ICD-10-CM | POA: Insufficient documentation

## 2015-03-29 MED ORDER — HYDROCODONE-ACETAMINOPHEN 5-325 MG PO TABS: 1.0000 | ORAL_TABLET | ORAL | Status: DC | PRN

## 2015-03-29 MED ORDER — METHOCARBAMOL 1000 MG/10ML IJ SOLN
1000.0000 mg | Freq: Once | INTRAMUSCULAR | Status: DC
  Filled 2015-03-29: qty 10

## 2015-03-29 MED ORDER — METHOCARBAMOL 500 MG PO TABS
1000.0000 mg | ORAL_TABLET | Freq: Once | ORAL | Status: AC
  Administered 2015-03-29: 1000 mg via ORAL
  Filled 2015-03-29: qty 2

## 2015-03-29 MED ORDER — ONDANSETRON 4 MG PO TBDP
4.0000 mg | ORAL_TABLET | Freq: Once | ORAL | Status: AC
  Administered 2015-03-29: 4 mg via ORAL
  Filled 2015-03-29: qty 1

## 2015-03-29 MED ORDER — METHOCARBAMOL 500 MG PO TABS: 1000.0000 mg | ORAL_TABLET | Freq: Two times a day (BID) | ORAL | Status: DC

## 2015-03-29 MED ORDER — OXYCODONE-ACETAMINOPHEN 5-325 MG PO TABS
1.0000 | ORAL_TABLET | Freq: Once | ORAL | Status: DC
  Filled 2015-03-29: qty 1

## 2015-03-29 MED ORDER — KETOROLAC TROMETHAMINE 60 MG/2ML IM SOLN
30.0000 mg | Freq: Once | INTRAMUSCULAR | Status: AC
  Administered 2015-03-29: 30 mg via INTRAMUSCULAR
  Filled 2015-03-29: qty 2
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
## 2015-03-29 NOTE — ED Notes (Signed)
Patient transported to X-ray 

## 2015-03-29 NOTE — Discharge Instructions (Signed)
As we discussed, you might feel more sore over the next couple of days before you start to feel better. You may take Vicodin as needed for pain. You may also take 800mg  ibuprofen (Advil) in addition or instead of the vicodin which will help with pain and inflammation. I also prescribed Robaxin, which is a muscle relaxant. Please follow-up with your primary care provider within one week. If you start having severe pain, chest pain, difficulty breathing, or other concerning symptoms, return to the ER.  Please obtain all of your results from medical records or have your doctors office obtain the results - share them with your doctor - you should be seen at your doctors office in the next 2 days. Call today to arrange your follow up. Take the medications as prescribed. Please review all of the medicines and only take them if you do not have an allergy to them. Please be aware that if you are taking birth control pills, taking other prescriptions, ESPECIALLY ANTIBIOTICS may make the birth control ineffective - if this is the case, either do not engage in sexual activity or use alternative methods of birth control such as condoms until you have finished the medicine and your family doctor says it is OK to restart them. If you are on a blood thinner such as COUMADIN, be aware that any other medicine that you take may cause the coumadin to either work too much, or not enough - you should have your coumadin level rechecked in next 7 days if this is the case.  ?  It is also a possibility that you have an allergic reaction to any of the medicines that you have been prescribed - Everybody reacts differently to medications and while MOST people have no trouble with most medicines, you may have a reaction such as nausea, vomiting, rash, swelling, shortness of breath. If this is the case, please stop taking the medicine immediately and contact your physician.  ?  You should return to the ER if you develop severe or worsening  symptoms.

## 2015-08-31 ENCOUNTER — Other Ambulatory Visit: Payer: Self-pay | Admitting: Internal Medicine

## 2015-09-02 NOTE — Telephone Encounter (Signed)
Rx filled.  Katina Degreealeb M. Jimmey RalphParker, MD [facility] Resident PGY-2 09/02/2015 8:45 AM

## 2015-12-24 ENCOUNTER — Emergency Department (HOSPITAL_COMMUNITY)
Admission: EM | Admit: 2015-12-24 | Discharge: 2015-12-24 | Disposition: A | Discharge status: Home / Self Care | Payer: BC Managed Care – PPO | Attending: Emergency Medicine | Admitting: Emergency Medicine

## 2015-12-24 ENCOUNTER — Emergency Department (HOSPITAL_COMMUNITY): Payer: BC Managed Care – PPO

## 2015-12-24 ENCOUNTER — Encounter (HOSPITAL_COMMUNITY): Payer: Self-pay | Admitting: Emergency Medicine

## 2015-12-24 DIAGNOSIS — Y939 Activity, unspecified: Secondary | ICD-10-CM | POA: Insufficient documentation

## 2015-12-24 DIAGNOSIS — W25XXXA Contact with sharp glass, initial encounter: Secondary | ICD-10-CM | POA: Insufficient documentation

## 2015-12-24 DIAGNOSIS — Z79899 Other long term (current) drug therapy: Secondary | ICD-10-CM | POA: Insufficient documentation

## 2015-12-24 DIAGNOSIS — F17200 Nicotine dependence, unspecified, uncomplicated: Secondary | ICD-10-CM | POA: Insufficient documentation

## 2015-12-24 DIAGNOSIS — J45909 Unspecified asthma, uncomplicated: Secondary | ICD-10-CM | POA: Insufficient documentation

## 2015-12-24 DIAGNOSIS — S61432A Puncture wound without foreign body of left hand, initial encounter: Secondary | ICD-10-CM | POA: Diagnosis not present

## 2015-12-24 DIAGNOSIS — S61412A Laceration without foreign body of left hand, initial encounter: Secondary | ICD-10-CM | POA: Insufficient documentation

## 2015-12-24 DIAGNOSIS — Y929 Unspecified place or not applicable: Secondary | ICD-10-CM | POA: Insufficient documentation

## 2015-12-24 DIAGNOSIS — IMO0002 Reserved for concepts with insufficient information to code with codable children: Secondary | ICD-10-CM

## 2015-12-24 DIAGNOSIS — Y999 Unspecified external cause status: Secondary | ICD-10-CM | POA: Insufficient documentation

## 2015-12-24 MED ORDER — LIDOCAINE-EPINEPHRINE 2 %-1:100000 IJ SOLN
10.0000 mL | Freq: Once | INTRAMUSCULAR | Status: AC
  Administered 2015-12-24: 10 mL
  Filled 2015-12-24: qty 1
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
## 2015-12-24 NOTE — Discharge Instructions (Signed)
Keep wound clean using Dilantin bacterial soap and water, pat dry. He may apply a small amount of Neosporin antibiotic ointment to wound daily. Return to the emergency Department, urgent care or your family doctor in 7 days for suture removal. Return to the emergency department if symptoms worsen or new onset of fever, redness, swelling, warmth, drainage, numbness, tingling, weakness.

## 2015-12-24 NOTE — ED Notes (Signed)
PT DISCHARGED. INSTRUCTIONS GIVEN. AAOX4. PT IN NO APPARENT DISTRESS OR PAIN. THE OPPORTUNITY TO ASK QUESTIONS WAS PROVIDED. 

## 2015-12-24 NOTE — ED Provider Notes (Signed)
[facility]-EMERGENCY DEPT Provider Note   CSN: [id number] Arrival date & time: 12/24/15  1208  First Provider Contact:  First MD Initiated Contact with Patient 12/24/15 1506        History   Chief Complaint Chief Complaint  Patient presents with  . Extremity Laceration    HPI Theodore Cruz is a 30 y.o. male.  Patient is a 30 year old male with no pertinent past medical history presents the ED with complaint of extremity laceration, onset prior to arrival. Patient reports he was holding a small glass shelf and once he placed it in the trashcan the glass shattered resulting in him cutting his left hand. Patient denies numbness, tingling, weakness. Bleeding controlled prior to arrival. Denies use of anticoagulants. Tetanus up-to-date.      Past Medical History:  Diagnosis Date  . Asthma   . Seizures [facility])     Patient Active Problem List   Diagnosis Date Noted  . Allergy 12/11/2014  . Tobacco abuse 06/12/2014  . ASTHMA, CHILDHOOD 07/03/2008  . FOLLICULITIS 01/16/2008  . Seizure disorder (HCC) 08/22/2007    No past surgical history on file.     Home Medications    Prior to Admission medications   Medication Sig Start Date End Date Taking? Authorizing Provider  albuterol (PROVENTIL HFA;VENTOLIN HFA) 108 (90 BASE) MCG/ACT inhaler Inhale 2 puffs into the lungs every 6 (six) hours as needed for wheezing or shortness of breath. 03/19/14   Pearline Cables, MD  HYDROcodone-acetaminophen (NORCO/VICODIN) 5-325 MG tablet Take 1 tablet by mouth every 4 (four) hours as needed. 03/29/15   Ace Gins Sam, PA-C  levETIRAcetam (KEPPRA) 100 MG/ML solution TAKE 1 TEASPOONFUL( ) BY MOUTH 2 TIMES DAILY. PATIENT NEEDS OFFICE VISIT FOR ADDITIONAL REFILLS 09/02/15   Ardith Dark, MD  methocarbamol (ROBAXIN) 500 MG tablet Take 2 tablets (1,000 mg total) by mouth 2 (two) times daily. 03/29/15   Carlene Coria, PA-C    Family History Family History  Problem Relation Age of Onset  . Alcohol  abuse Paternal Grandfather     Social History Social History  Substance Use Topics  . Smoking status: Current Every Day Smoker    Packs/day: 1.00  . Smokeless tobacco: Never Used  . Alcohol use Yes     Allergies   Chocolate flavor; Eggs or egg-derived products; and Peanut-containing drug products   Review of Systems Review of Systems  Constitutional: Negative for fever.  Skin: Positive for wound (laceration).  Neurological: Negative for weakness and numbness.     Physical Exam Updated Vital Signs BP 121/69 (BP Location: Left Arm)   Pulse 79   Temp 98.4 F (36.9 C) (Oral)   Resp 16   SpO2 100%   Physical Exam  Constitutional: He is oriented to person, place, and time. He appears well-developed and well-nourished.  HENT:  Head: Normocephalic and atraumatic.  Eyes: Conjunctivae and EOM are normal. Right eye exhibits no discharge. Left eye exhibits no discharge. No scleral icterus.  Pulmonary/Chest: Effort normal.  Musculoskeletal:       Right hand: He exhibits normal range of motion, no tenderness, no bony tenderness, normal two-point discrimination, normal capillary refill, no deformity, no laceration and no swelling. Normal sensation noted. Normal strength noted.       Left hand: He exhibits laceration. He exhibits normal range of motion, no tenderness, no bony tenderness, normal two-point discrimination, normal capillary refill, no deformity and no swelling. Normal sensation noted. Normal strength noted.  Full range of motion of left  hand wrist forearm and elbow. Equal grip strength bilaterally. 2+ radial pulses. Sensation grossly intact. Cap refill less than 2.  Neurological: He is alert and oriented to person, place, and time.  Skin:  0.5cm superficial laceration noted to medial aspect of left palm, no active bleeding.  Nursing note and vitals reviewed.    ED Treatments / Results  Labs (all labs ordered are listed, but only abnormal results are displayed) Labs  Reviewed - No data to display  EKG  EKG Interpretation None       Radiology Dg Hand Complete Left  Result Date: 12/24/2015 CLINICAL DATA:  Puncture wound from glass. EXAM: LEFT HAND - COMPLETE 3+ VIEW COMPARISON:  None. FINDINGS: Frontal, oblique, and lateral views were obtained. There is no fracture or dislocation. The joint spaces appear normal. No soft tissue air or radiopaque foreign body. IMPRESSION: No fracture or dislocation. No radiopaque foreign body or soft tissue air. No apparent arthropathy. Electronically Signed   By: Bretta Bang III M.D.   On: 12/24/2015 14:27   Procedures .Marland KitchenLaceration Repair Date/Time: 12/24/2015 4:04 PM Performed by: Barrett Henle Authorized by: Barrett Henle   Consent:    Consent obtained:  Verbal   Consent given by:  Patient Anesthesia (see MAR for exact dosages):    Anesthesia method:  Local infiltration   Local anesthetic:  Lidocaine 2% WITH epi Laceration details:    Location:  Hand   Hand location:  L palm (left medial palm)   Length (cm):  0.5 Repair type:    Repair type:  Simple Pre-procedure details:    Preparation:  Patient was prepped and draped in usual sterile fashion and imaging obtained to evaluate for foreign bodies Exploration:    Wound exploration: wound explored through full range of motion and entire depth of wound probed and visualized   Treatment:    Area cleansed with:  Betadine   Amount of cleaning:  Standard   Irrigation solution:  Sterile saline   Irrigation method:  Syringe   Visualized foreign bodies/material removed: no   Skin repair:    Repair method:  Sutures   Suture size:  5-0   Suture material:  Prolene   Suture technique:  Simple interrupted   Number of sutures:  1 Approximation:    Approximation:  Close Post-procedure details:    Dressing:  Adhesive bandage   Patient tolerance of procedure:  Tolerated well, no immediate complications   (including critical care  time)  Medications Ordered in ED Medications  lidocaine-EPINEPHrine (XYLOCAINE W/EPI) 2 %-1:100000 (with pres) injection 10 mL (not administered)     Initial Impression / Assessment and Plan / ED Course  I have reviewed the triage vital signs and the nursing notes.  Pertinent labs & imaging results that were available during my care of the patient were reviewed by me and considered in my medical decision making (see chart for details).  Clinical Course    Pressure irrigation performed. Wound explored and base of wound visualized in a bloodless field without evidence of foreign body.  Laceration occurred < 8 hours prior to repair which was well tolerated. Tdap UTD.  Pt has no comorbidities to effect normal wound healing. Pt discharged without antibiotics.  Discussed suture home care with patient and answered questions. Pt to follow-up for wound check and suture removal in 7 days; they are to return to the ED sooner for signs of infection. Pt is hemodynamically stable with no complaints prior to dc.  Final Clinical Impressions(s) / ED Diagnoses   Final diagnoses:  None    New Prescriptions New Prescriptions   No medications on file     Barrett Henle, PA-C 12/24/15 1606    Doug Sou, MD 12/24/15 [phone]

## 2016-05-27 ENCOUNTER — Other Ambulatory Visit: Payer: Self-pay | Admitting: *Deleted

## 2016-05-27 MED ORDER — LEVETIRACETAM 100 MG/ML PO SOLN: ORAL | 0 refills | Status: DC

## 2016-05-27 NOTE — Telephone Encounter (Signed)
LM for patient to call back. Will mail letter if not able to reach him tomorrow. Jazmin Hartsell,CMA

## 2016-05-27 NOTE — Telephone Encounter (Signed)
[name] Patient has not been here in almost 2 years. He needs an office visit for further refills.  Katina Degreealeb M. Jimmey RalphParker, MD [facility] Resident PGY-3 05/27/2016 3:18 PM

## 2016-05-28 NOTE — Telephone Encounter (Signed)
LM for patient to call back and I have also mailed him a letter asking him to contact us for an appointment before we can refill his Keppra again. Jazmin Hartsell,CMA

## 2016-06-27 ENCOUNTER — Other Ambulatory Visit: Payer: Self-pay | Admitting: Family Medicine

## 2016-07-01 NOTE — Telephone Encounter (Signed)
Called patient on cell and made appt for 07-10-16. Lendora Keys,CMA

## 2016-07-01 NOTE — Telephone Encounter (Signed)
Rx not filled. He has not been here in 2 years. He needs an office appointment.

## 2016-07-01 NOTE — Telephone Encounter (Signed)
Spoke with mother and asked her to have patient call us to schedule an appointment. Jacquelin Krajewski,CMA

## 2016-07-10 ENCOUNTER — Ambulatory Visit (INDEPENDENT_AMBULATORY_CARE_PROVIDER_SITE_OTHER): Payer: BLUE CROSS/BLUE SHIELD | Admitting: Family Medicine

## 2016-07-10 ENCOUNTER — Encounter: Payer: Self-pay | Admitting: Family Medicine

## 2016-07-10 VITALS — BP 110/70 | HR 90 | Temp 98.1°F | Wt 192.0 lb

## 2016-07-10 DIAGNOSIS — G40909 Epilepsy, unspecified, not intractable, without status epilepticus: Secondary | ICD-10-CM | POA: Diagnosis not present

## 2016-07-10 DIAGNOSIS — Z114 Encounter for screening for human immunodeficiency virus [HIV]: Secondary | ICD-10-CM | POA: Diagnosis not present

## 2016-07-10 MED ORDER — LEVETIRACETAM 100 MG/ML PO SOLN: ORAL | 11 refills | Status: DC

## 2016-07-10 NOTE — Assessment & Plan Note (Signed)
Doing well on keppra. Will continue. Refilled today.

## 2016-07-10 NOTE — Progress Notes (Signed)
    Subjective:  Theodore FischerJoshua Cruz is a 31 y.o. male [facility] with a chief complaint of seizure disorder.   HPI:  Seizure Disorder Patient currently on keppra daily. He is tolerating this well without any obvious side effects. No lethargy or confusion. No anxiety, depression, or mood swings. Patient denies having any seizures for the past several years.   ROS: Per HPI  Objective:  Physical Exam: BP 110/70   Pulse 90   Temp 98.1 F (36.7 C) (Oral)   Wt 192 lb (87.1 kg)   SpO2 99%   BMI 30.07 kg/m   Gen: NAD, resting comfortably CV: RRR with no murmurs appreciated Pulm: NWOB, CTAB with no crackles, wheezes, or rhonchi MSK: no edema, cyanosis, or clubbing noted Skin: warm, dry Neuro: CN2-12 intact. Cerebellar function intact. Strength 5/5 in upper and lower extremities. Sensation to light touch intact throughout. grossly normal, moves all extremities Psych: Normal affect and thought content  Assessment/Plan:  Seizure disorder Doing well on keppra. Will continue. Refilled today.   Katina Degreealeb M. Jimmey RalphParker, MD [facility] Resident PGY-3 07/10/2016 3:02 PM

## 2016-07-10 NOTE — Patient Instructions (Signed)
It was nice seeing you.  We will check blood work.  Come back to see us in 1 year, or sooner if you need anything else.  Take care,  Dr Jimmey RalphParker

## 2016-07-11 ENCOUNTER — Encounter: Payer: Self-pay | Admitting: Family Medicine

## 2016-07-11 LAB — HIV ANTIBODY (ROUTINE TESTING W REFLEX): HIV 1&2 Ab, 4th Generation: NONREACTIVE

## 2017-06-30 ENCOUNTER — Other Ambulatory Visit: Payer: Self-pay | Admitting: *Deleted

## 2017-07-01 MED ORDER — LEVETIRACETAM 100 MG/ML PO SOLN: ORAL | 11 refills | Status: DC
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
## 2018-05-08 IMAGING — CR DG HAND COMPLETE 3+V*L*
3 series · 3 of 3 positions shown · non-contrast
Comparison: None.

CLINICAL DATA: Puncture wound from glass.

EXAM:
LEFT HAND - COMPLETE 3+ VIEW

[x hand pa left]
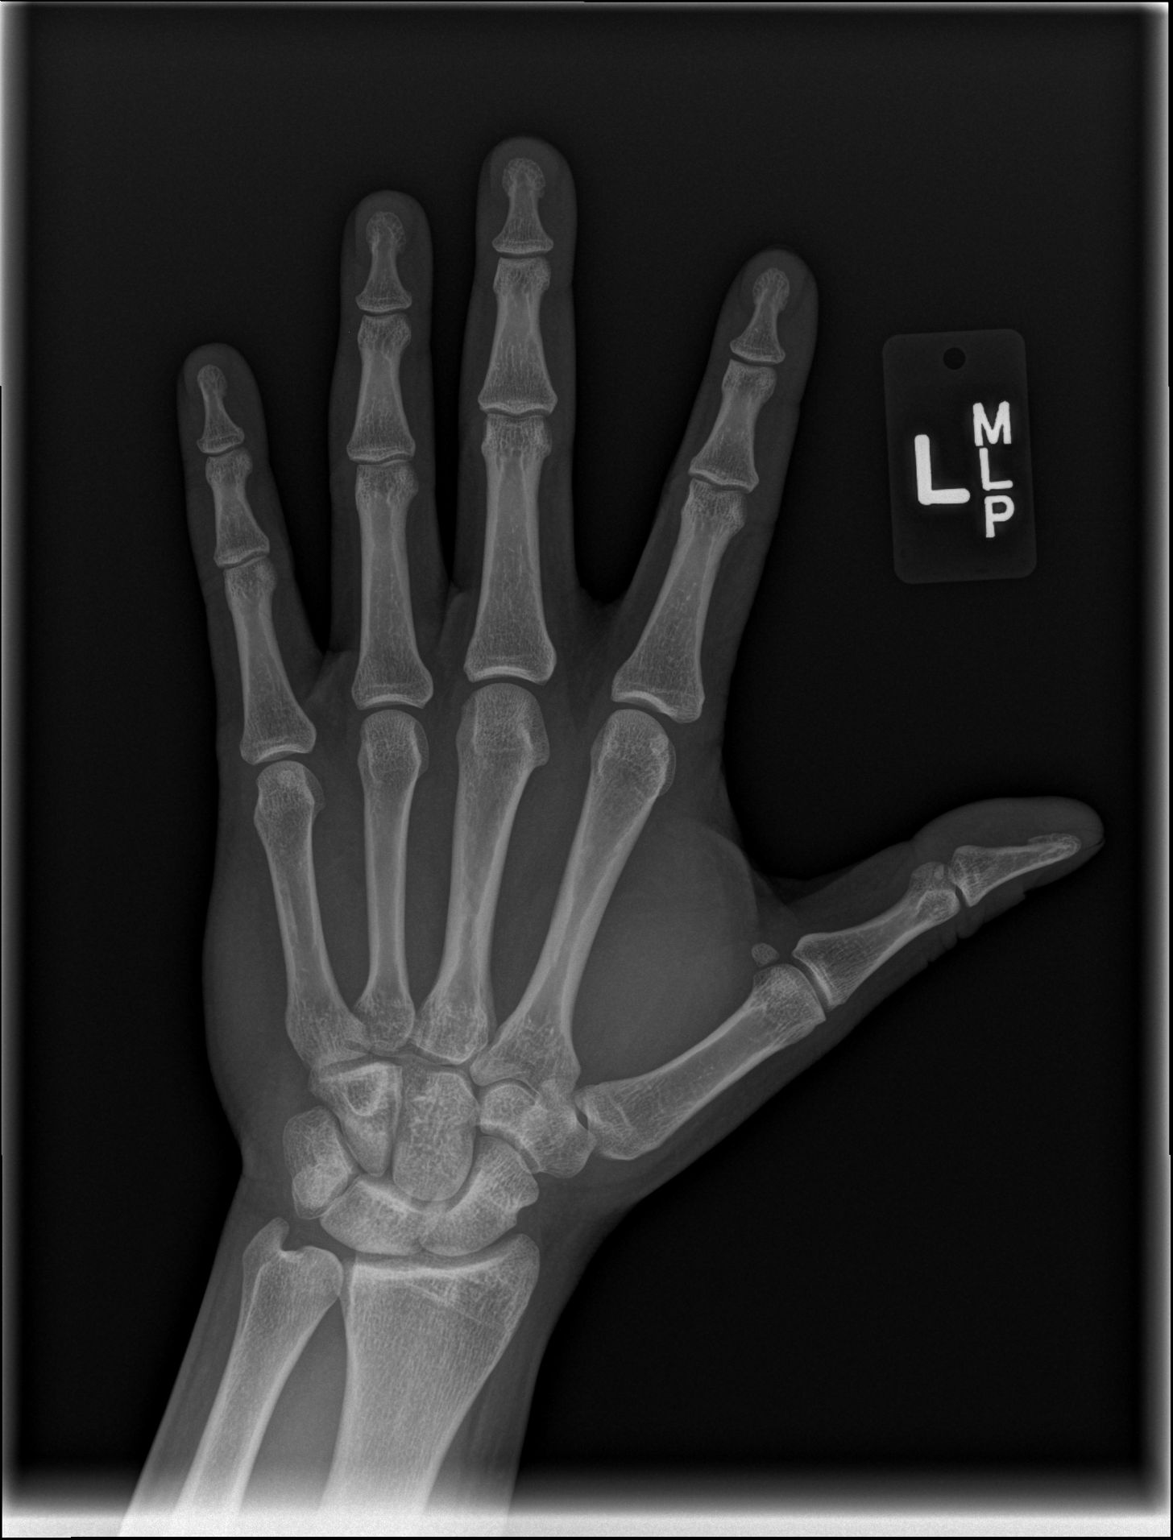

[x hand obl left]
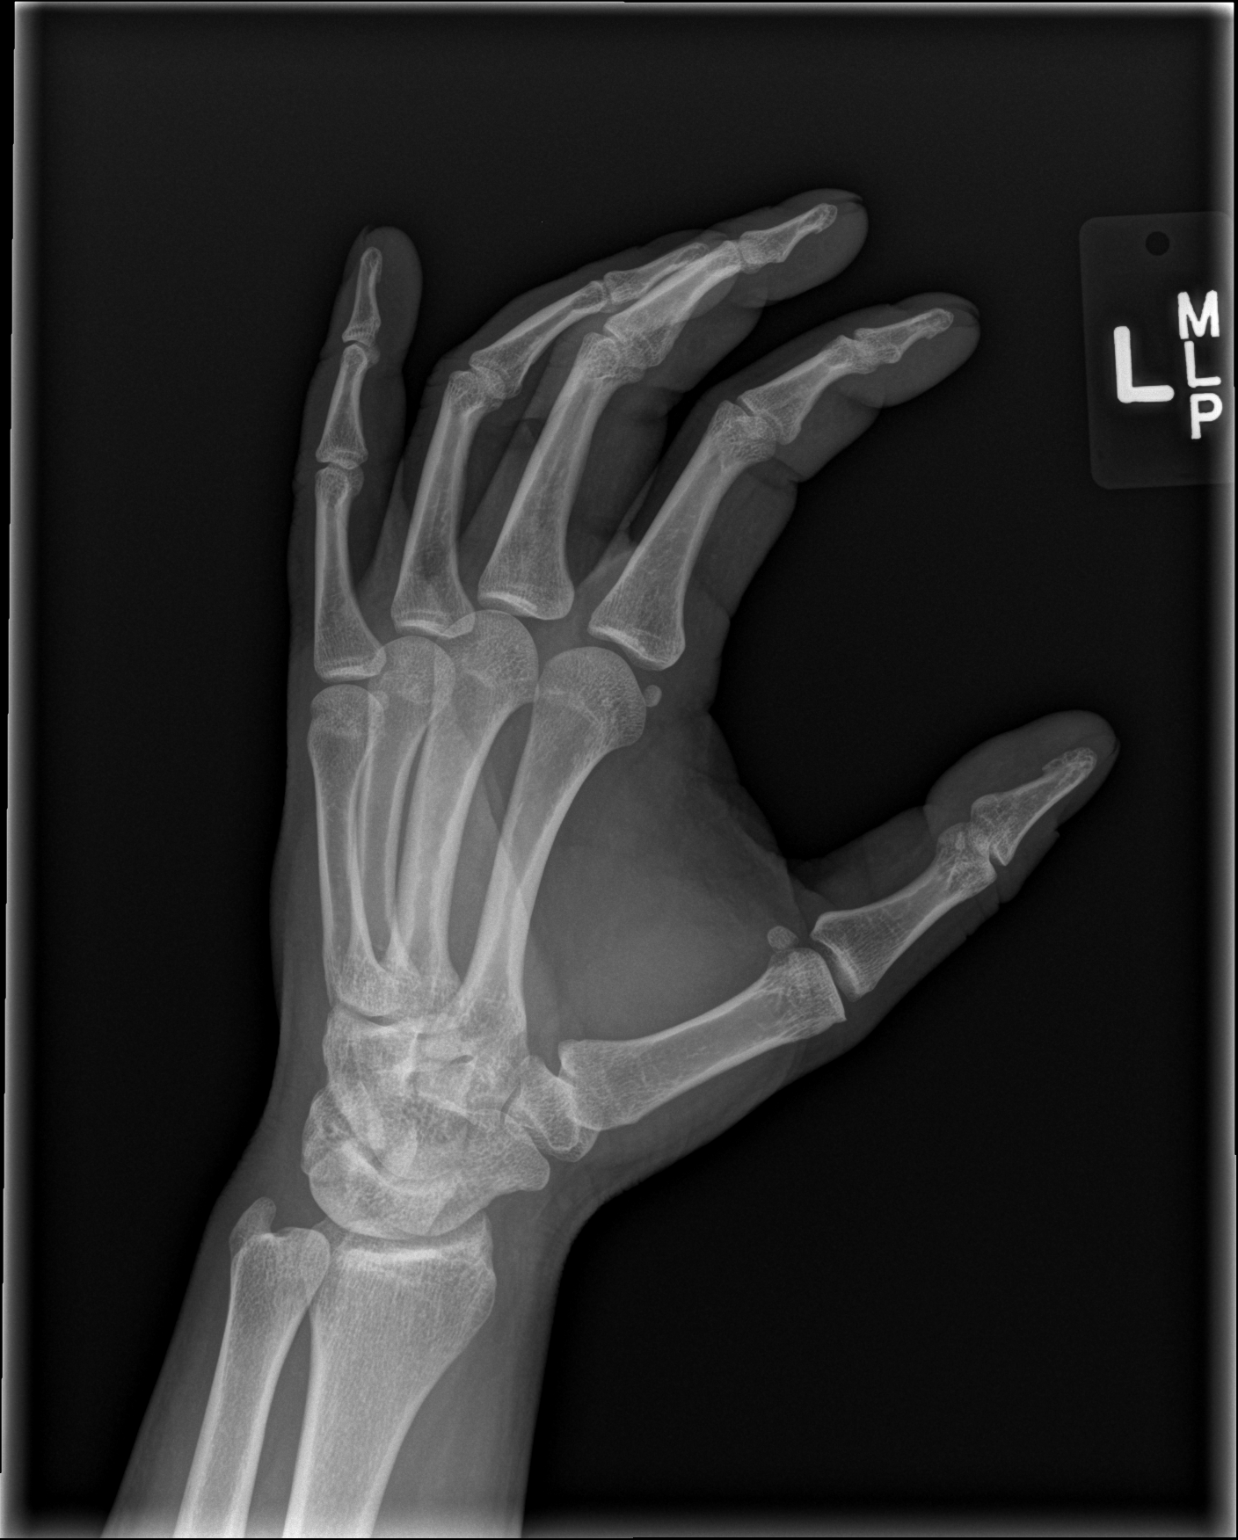

[x hand lat left]
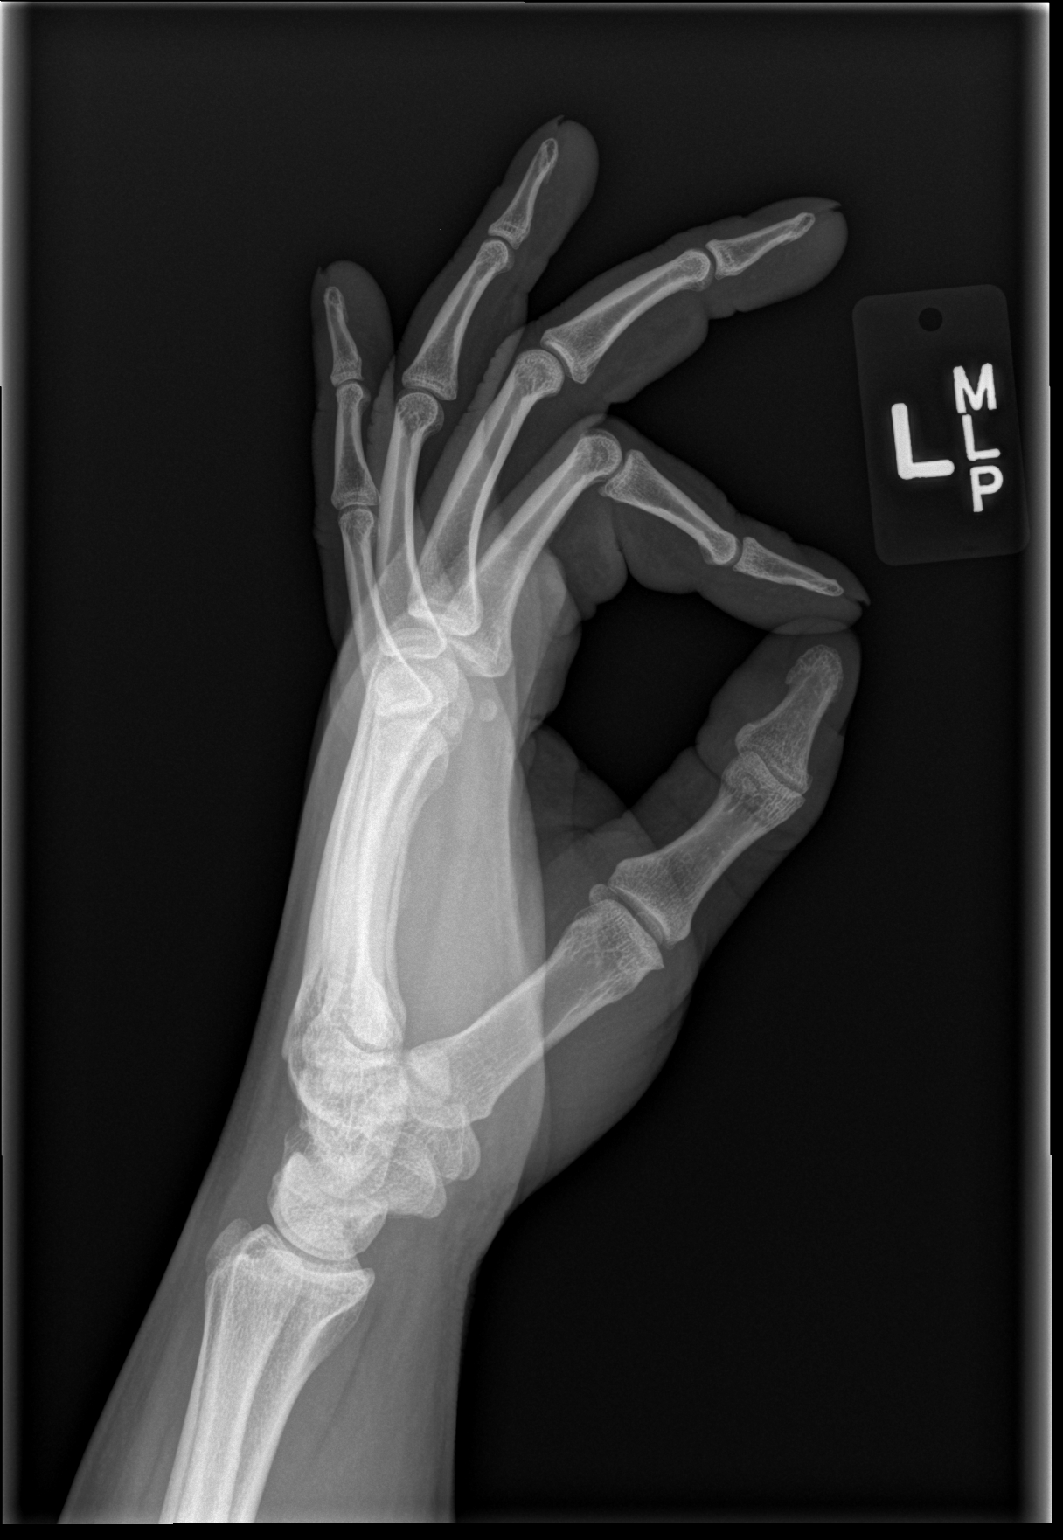

[3 of 3 positions shown; findings below may reference images not displayed]

FINDINGS: Frontal, oblique, and lateral views were obtained. There is no
fracture or dislocation. The joint spaces appear normal. No soft
tissue air or radiopaque foreign body.
IMPRESSION: No fracture or dislocation. No radiopaque foreign body or soft
tissue air. No apparent arthropathy.

## 2018-07-13 ENCOUNTER — Other Ambulatory Visit: Payer: Self-pay | Admitting: *Deleted

## 2018-07-14 ENCOUNTER — Other Ambulatory Visit: Payer: Self-pay

## 2018-07-16 MED ORDER — LEVETIRACETAM 100 MG/ML PO SOLN: ORAL | 11 refills | Status: DC

## 2019-11-19 ENCOUNTER — Encounter (HOSPITAL_COMMUNITY): Payer: Self-pay

## 2019-11-19 ENCOUNTER — Other Ambulatory Visit: Payer: Self-pay

## 2019-11-19 ENCOUNTER — Ambulatory Visit (HOSPITAL_COMMUNITY)
Admission: EM | Admit: 2019-11-19 | Discharge: 2019-11-19 | Disposition: A | Discharge status: Home / Self Care | Payer: BC Managed Care – PPO | Attending: Physician Assistant | Admitting: Physician Assistant

## 2019-11-19 DIAGNOSIS — H7402 Tympanosclerosis, left ear: Secondary | ICD-10-CM | POA: Diagnosis not present

## 2019-11-19 DIAGNOSIS — H9192 Unspecified hearing loss, left ear: Secondary | ICD-10-CM

## 2019-11-19 MED ORDER — FLUTICASONE PROPIONATE 50 MCG/ACT NA SUSP: 1.0000 | Freq: Every day | NASAL | 0 refills | Status: AC

## 2019-11-19 NOTE — ED Triage Notes (Signed)
Pt c/o ringing in left earx1wk.

## 2019-11-19 NOTE — Discharge Instructions (Signed)
Use flonase daily  Call the ENT for evaluation

## 2019-11-19 NOTE — ED Provider Notes (Signed)
[facility]    CSN: [id number] Arrival date & time: 11/19/19  1250      History   Chief Complaint Chief Complaint  Patient presents with  . ringing in ear    HPI Theodore Cruz is a 34 y.o. male.   Patient presents with 1 week history of hearing change in the left ear.  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  He reports 5 days ago abruptly felt like he was" in a bathroom when being talked to."  Denies any ringing in the ears when it is quiet.  Denies ear pain. Denies ear drainage.   Does endorse chronic nasal congestion. Denies dizziness. Reports he takes allegra for allergies.      Past Medical History:  Diagnosis Date  . Asthma   . Seizures [facility])     Patient Active Problem List   Diagnosis Date Noted  . Allergy 12/11/2014  . Tobacco abuse 06/12/2014  . ASTHMA, CHILDHOOD 07/03/2008  . FOLLICULITIS 01/16/2008  . Seizure disorder (HCC) 08/22/2007    History reviewed. No pertinent surgical history.     Home Medications    Prior to Admission medications   Medication Sig Start Date End Date Taking? Authorizing Provider  fluticasone (FLONASE) 50 MCG/ACT nasal spray Place 1 spray into both nostrils daily. 11/19/19   Rubie Ficco, Veryl Speak, PA-C  levETIRAcetam (KEPPRA) 100 MG/ML solution TAKE 1 TEASPOONFUL( ) BY MOUTH 2 TIMES DAILY. PATIENT NEEDS OFFICE VISIT FOR ADDITIONAL REFILLS 07/16/18   Garnette Gunner, MD    Family History Family History  Problem Relation Age of Onset  . Alcohol abuse Paternal Grandfather     Social History Social History   Tobacco Use  . Smoking status: Current Every Day Smoker    Packs/day: 1.00  . Smokeless tobacco: Never Used  Substance Use Topics  . Alcohol use: Yes    Alcohol/week: 1.0 standard drink    Types: 1 Cans of beer per week  . Drug use: No     Allergies   Chocolate flavor, Eggs or egg-derived products, and Peanut-containing drug products   Review of Systems Review of Systems   Physical  Exam Triage Vital Signs ED Triage Vitals  Enc Vitals Group     BP 11/19/19 1432 (!) 160/92     Pulse Rate 11/19/19 1432 (!) 103     Resp 11/19/19 1432 16     Temp 11/19/19 1432 98.4 F (36.9 C)     Temp Source 11/19/19 1432 Oral     SpO2 11/19/19 1432 97 %     Weight 11/19/19 1433 212 lb (96.2 kg)     Height 11/19/19 1433 5\' 7"  (1.702 m)     Head Circumference --      Peak Flow --      Pain Score 11/19/19 1433 0     Pain Loc --      Pain Edu? --      Excl. in GC? --    No data found.  Updated Vital Signs BP (!) 160/92   Pulse (!) 103   Temp 98.4 F (36.9 C) (Oral)   Resp 16   Ht 5\' 7"  (1.702 m)   Wt 212 lb (96.2 kg)   SpO2 97%   BMI 33.20 kg/m   Visual Acuity Right Eye Distance:   Left Eye Distance:   Bilateral Distance:    Right Eye Near:   Left Eye Near:    Bilateral Near:  Physical Exam Vitals and nursing note reviewed.  Constitutional:      Appearance: Normal appearance.  HENT:     Right Ear: Tympanic membrane, ear canal and external ear normal.     Left Ear: Ear canal and external ear normal.     Ears:      Comments: Left TM with sclerotic changes.  Difficult to discern whether there is serous fluid in.  Nonerythematous and nonbulging.  Canals normal.  Hearing grossly intact  Areas marked are areas of sclerotic changes    Nose: Congestion and rhinorrhea present.     Mouth/Throat:     Mouth: Mucous membranes are moist.  Neurological:     Mental Status: He is alert.      UC Treatments / Results  Labs (all labs ordered are listed, but only abnormal results are displayed) Labs Reviewed - No data to display  EKG   Radiology No results found.  Procedures Procedures (including critical care time)  Medications Ordered in UC Medications - No data to display  Initial Impression / Assessment and Plan / UC Course  I have reviewed the triage vital signs and the nursing notes.  Pertinent labs & imaging results that were available during my  care of the patient were reviewed by me and considered in my medical decision making (see chart for details).     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 History consistent with possible serous effusion, unable to appreciate on exam given sclerosis present. Discussed use of flonase and to continue his allergy medicines. Recommended ENT follow up given change in hearing with current lack of hearing in Right ear. Patient verbalizes agreement with the plan.  Final Clinical Impressions(s) / UC Diagnoses   Final diagnoses:  Decreased hearing of left ear  Tympanosclerosis of left ear     Discharge Instructions     Use flonase daily  Call the ENT for evaluation    ED Prescriptions    Medication Sig Dispense Auth. Provider   fluticasone (FLONASE) 50 MCG/ACT nasal spray Place 1 spray into both nostrils daily. 11.1 mL Mercy Leppla, Marguerita Beards, PA-C     PDMP not reviewed this encounter.   Purnell Shoemaker, PA-C 11/20/19 0017

## 2019-12-26 DIAGNOSIS — H905 Unspecified sensorineural hearing loss: Secondary | ICD-10-CM | POA: Insufficient documentation

## 2019-12-26 DIAGNOSIS — H9312 Tinnitus, left ear: Secondary | ICD-10-CM | POA: Insufficient documentation

## 2021-03-19 ENCOUNTER — Other Ambulatory Visit: Payer: Self-pay

## 2021-03-19 ENCOUNTER — Encounter (HOSPITAL_COMMUNITY): Payer: Self-pay | Admitting: Emergency Medicine

## 2021-03-19 ENCOUNTER — Ambulatory Visit (HOSPITAL_COMMUNITY)
Admission: EM | Admit: 2021-03-19 | Discharge: 2021-03-19 | Disposition: A | Discharge status: Home / Self Care | Payer: BC Managed Care – PPO | Attending: Family Medicine | Admitting: Family Medicine

## 2021-03-19 DIAGNOSIS — G40909 Epilepsy, unspecified, not intractable, without status epilepticus: Secondary | ICD-10-CM

## 2021-03-19 MED ORDER — LEVETIRACETAM 100 MG/ML PO SOLN: ORAL | 2 refills | Status: DC

## 2021-03-19 NOTE — ED Triage Notes (Signed)
Patient is requesting a seizure medicine .  Patient unable to provide name of drug.  Patient reports provider is booked with appts for at least 5 months

## 2021-03-19 NOTE — ED Provider Notes (Signed)
[facility]   [id number] 03/19/21 Arrival Time: 1151  ASSESSMENT & PLAN:  1. Seizure disorder (HCC)    No recent seizure activity. Discussed importance of PCP f/u.  Refilled: Meds ordered this encounter  Medications   levETIRAcetam (KEPPRA) 100 MG/ML solution    Sig: TAKE 1 TEASPOONFUL( ) BY MOUTH 2 TIMES DAILY.    Dispense:  300 mL    Refill:  2    Follow-up Information     Schedule an appointment as soon as possible for a visit  with [facility].   Contact information: [address] [place] [address] [phone]                 Reviewed expectations re: course of current medical issues. Questions answered. Outlined signs and symptoms indicating need for more acute intervention. Patient verbalized understanding. After Visit Summary given.   SUBJECTIVE: History from: patient. Theodore Cruz is a 35 y.o. male who presents requesting medication refill. No current concerns. No recent seizure activity. About ot run out of Keppra.  Current medical problems include: Past Medical History:  Diagnosis Date   Asthma    Seizures (HCC)           Current Outpatient Medications (Respiratory):    fluticasone (FLONASE) 50 MCG/ACT nasal spray, Place 1 spray into both nostrils daily.       Current Outpatient Medications (Other):    levETIRAcetam (KEPPRA) 100 MG/ML solution, TAKE 1 TEASPOONFUL( ) BY MOUTH 2 TIMES DAILY. No current facility-administered medications for this encounter.  No current facility-administered medications for this encounter.  Current Outpatient Medications:    fluticasone (FLONASE) 50 MCG/ACT nasal spray, Place 1 spray into both nostrils daily., Disp: 11.1 mL, Rfl: 0   levETIRAcetam (KEPPRA) 100 MG/ML solution, TAKE 1 TEASPOONFUL( ) BY MOUTH 2 TIMES DAILY., Disp: 300 mL, Rfl: 2    OBJECTIVE:  Vitals:   03/19/21 1258  BP: 132/88  Pulse: 97  Resp: 20  Temp: 98.2 F (36.8  C)  TempSrc: Oral  SpO2: 98%    General appearance: alert; no distress Eyes: PERRLA; EOMI; conjunctiva normal HENT: normocephalic; atraumatic Neck: supple  Extremities: no cyanosis or edema; symmetrical with no gross deformities Skin: warm and dry Neurologic: normal gait Psychological: alert and cooperative; normal mood and affect   Allergies  Allergen Reactions   Chocolate Flavor Shortness Of Breath and Swelling   Eggs Or Egg-Derived Products Shortness Of Breath and Swelling   Peanut-Containing Drug Products Shortness Of Breath and Swelling    Social History   Socioeconomic History   Marital status: Single    Spouse name: Not on file   Number of children: Not on file   Years of education: Not on file   Highest education level: Not on file  Occupational History   Not on file  Tobacco Use   Smoking status: Every Day    Packs/day: 1.00    Types: Cigarettes   Smokeless tobacco: Never  Vaping Use   Vaping Use: Never used  Substance and Sexual Activity   Alcohol use: Yes    Alcohol/week: 1.0 standard drink    Types: 1 Cans of beer per week   Drug use: No   Sexual activity: Not on file  Other Topics Concern   Not on file  Social History Narrative   Not on file   Social Determinants of Health   Financial Resource Strain: Not on file  Food Insecurity: Not on file  Transportation  Needs: Not on file  Physical Activity: Not on file  Stress: Not on file  Social Connections: Not on file  Intimate Partner Violence: Not on file   Family History  Problem Relation Age of Onset   Healthy Mother    Healthy Father    Alcohol abuse Paternal Grandfather    History reviewed. No pertinent surgical history.    Mardella Layman, MD 03/19/21 1336

## 2021-07-23 ENCOUNTER — Other Ambulatory Visit (HOSPITAL_BASED_OUTPATIENT_CLINIC_OR_DEPARTMENT_OTHER): Payer: Self-pay

## 2021-07-23 ENCOUNTER — Encounter (HOSPITAL_BASED_OUTPATIENT_CLINIC_OR_DEPARTMENT_OTHER): Payer: Self-pay | Admitting: Emergency Medicine

## 2021-07-23 ENCOUNTER — Other Ambulatory Visit: Payer: Self-pay

## 2021-07-23 ENCOUNTER — Emergency Department (HOSPITAL_BASED_OUTPATIENT_CLINIC_OR_DEPARTMENT_OTHER)
Admission: EM | Admit: 2021-07-23 | Discharge: 2021-07-23 | Disposition: A | Discharge status: Home / Self Care | Payer: BC Managed Care – PPO | Attending: Emergency Medicine | Admitting: Emergency Medicine

## 2021-07-23 DIAGNOSIS — R519 Headache, unspecified: Secondary | ICD-10-CM | POA: Insufficient documentation

## 2021-07-23 DIAGNOSIS — Z760 Encounter for issue of repeat prescription: Secondary | ICD-10-CM | POA: Diagnosis not present

## 2021-07-23 DIAGNOSIS — Z91010 Allergy to peanuts: Secondary | ICD-10-CM | POA: Insufficient documentation

## 2021-07-23 MED ORDER — LEVETIRACETAM 100 MG/ML PO SOLN
ORAL | 2 refills | Status: DC
  Filled 2021-07-23: qty 300, fill #0

## 2021-07-23 NOTE — ED Notes (Signed)
Pt. Came in for prescription refil for seizures.

## 2021-07-23 NOTE — ED Triage Notes (Signed)
Pt is out of his seizure meds, states his primary will not refill til seen by MD>

## 2021-07-23 NOTE — Discharge Instructions (Signed)
I sent 3 months refill of the Keppra to your pharmacy.  Above is information for a primary care group and a neurology practice in the area.  I put in a referral to the [facility] neurology clinic so they should reach out to you in the next few days to schedule an appointment.  If you do not hear from them by the end of the week you should reach out to them yourself by calling their office.  Please call the [facility] today and see if you can set an appointment for the near future.

## 2021-07-23 NOTE — ED Provider Notes (Signed)
[facility] Provider Note   CSN: [id number] Arrival date & time: 07/23/21  1321     History  Chief Complaint  Patient presents with   Headache    Theodore Cruz is a 36 y.o. male.   Headache  Patient with medical history of asthma and seizures well-controlled on Keppra presents due to running out of his medication refill.  Patient's medication refills have been filled previously by his family medicine provider, he has not been seen in the office for 2 years and told patient needs to be seen in the office before they continue with refills.  Patient ran out of his medicine 2 days ago.  He was seen in the ED 3 months ago for refill but has been unable to establish care with a neurology or primary care group in that time.  He is asymptomatic.    Denies any viral symptoms, has not had any fevers at home.  Home Medications Prior to Admission medications   Medication Sig Start Date End Date Taking? Authorizing Provider  fluticasone (FLONASE) 50 MCG/ACT nasal spray Place 1 spray into both nostrils daily. 11/19/19   Darr, Edison Nasuti, PA-C  levETIRAcetam (KEPPRA) 100 MG/ML solution TAKE 1 TEASPOONFUL( 5ML) BY MOUTH 2 TIMES DAILY. 07/23/21   Sherrill Raring, PA-C      Allergies    Chocolate flavor, Eggs or egg-derived products, and Peanut-containing drug products    Review of Systems   Review of Systems  Neurological:  Positive for headaches.   Physical Exam Updated Vital Signs BP (!) 139/95 (BP Location: Right Arm)    Pulse 96    Temp 99.5 F (37.5 C) (Oral)    Resp 18    SpO2 100%  Physical Exam Vitals and nursing note reviewed. Exam conducted with a chaperone present.  Constitutional:      General: He is not in acute distress.    Appearance: Normal appearance.  HENT:     Head: Normocephalic and atraumatic.  Eyes:     General: No scleral icterus.    Extraocular Movements: Extraocular movements intact.     Pupils: Pupils are equal, round, and reactive to  light.  Skin:    Coloration: Skin is not jaundiced.  Neurological:     Mental Status: He is alert. Mental status is at baseline.     GCS: GCS eye subscore is 4. GCS verbal subscore is 5. GCS motor subscore is 6.     Coordination: Coordination normal.    ED Results / Procedures / Treatments   Labs (all labs ordered are listed, but only abnormal results are displayed) Labs Reviewed - No data to display  EKG None  Radiology No results found.  Procedures Procedures    Medications Ordered in ED Medications - No data to display  ED Course/ Medical Decision Making/ A&P                           Medical Decision Making Risk Prescription drug management.   Patient is a 36 year old male presenting with running out of his Keppra.  He has been having issues getting established with a primary care group in the area.  He is medically compliant, symptom-free.  Risk of not refilling his Keppra outweighs the risk of refilling here in the ED.  Given he has been having issues getting established I will give him ambulatory referral for neurology.  Also provided information with will for neurology as well as the [facility] to follow-up with the PCP.  Given him refills to hopefully enable enough time to be seen in the office.        Final Clinical Impression(s) / ED Diagnoses Final diagnoses:  Medication refill    Rx / DC Orders ED Discharge Orders          Ordered    levETIRAcetam (KEPPRA) 100 MG/ML solution        07/23/21 1346    Ambulatory referral to Neurology       Comments: An appointment is requested in approximately: 2 weeks   07/23/21 1346              Sherrill Raring, Hershal Coria 07/23/21 2212    Dorie Rank, MD 07/25/21 [phone]

## 2021-09-05 ENCOUNTER — Ambulatory Visit: Payer: BC Managed Care – PPO | Admitting: Internal Medicine

## 2021-11-02 ENCOUNTER — Encounter (HOSPITAL_COMMUNITY): Payer: Self-pay | Admitting: Emergency Medicine

## 2021-11-02 ENCOUNTER — Ambulatory Visit (HOSPITAL_COMMUNITY)
Admission: EM | Admit: 2021-11-02 | Discharge: 2021-11-02 | Disposition: A | Discharge status: Home / Self Care | Payer: BC Managed Care – PPO | Attending: Emergency Medicine | Admitting: Emergency Medicine

## 2021-11-02 DIAGNOSIS — G4089 Other seizures: Secondary | ICD-10-CM

## 2021-11-02 DIAGNOSIS — J45909 Unspecified asthma, uncomplicated: Secondary | ICD-10-CM | POA: Diagnosis not present

## 2021-11-02 DIAGNOSIS — Z760 Encounter for issue of repeat prescription: Secondary | ICD-10-CM

## 2021-11-02 MED ORDER — ALBUTEROL SULFATE HFA 108 (90 BASE) MCG/ACT IN AERS: 2.0000 | INHALATION_SPRAY | Freq: Four times a day (QID) | RESPIRATORY_TRACT | 2 refills | Status: AC | PRN

## 2021-11-02 MED ORDER — LEVETIRACETAM 100 MG/ML PO SOLN: ORAL | 1 refills | Status: DC

## 2021-11-02 NOTE — Discharge Instructions (Addendum)
I have refilled your seizure medicine and sent in an albuterol inhaler for you to use if you develop symptoms.  Please go to your neurology appointment on the 7th.  Someone will be contacting you regarding setting up with a primary care provider in the area.

## 2021-11-02 NOTE — ED Triage Notes (Signed)
Pt reports needing medication refill of seizure medication that starts with L that is currently out Has appt later in week to see doctor.

## 2021-11-02 NOTE — ED Provider Notes (Signed)
[facility]    CSN: [id number] Arrival date & time: 11/02/21  1414     History   Chief Complaint Chief Complaint  Patient presents with   Medication Refill    HPI Theodore Cruz is a 36 y.o. male.  Presents for refill of his seizure medication.  He was seen in February at [facility] and given enough medicine to last him until now.  He does have an appointment with a neurologist set up for 3 days from now.  He has run out of his medicine today.  Denies any symptoms.  No headache, dizziness, blurry vision, weakness, chest pain, numbness or tingling, loss of sensation.  He has not had a seizure in many years.  Does have history of seasonal allergies and asthma.  Has not had any problems with his asthma since he was 15.  Denies shortness of breath or trouble breathing.  He has been taking daily Zyrtec.  Past Medical History:  Diagnosis Date   Asthma    Seizures [facility])     Patient Active Problem List   Diagnosis Date Noted   Allergy 12/11/2014   Tobacco abuse 06/12/2014   ASTHMA, CHILDHOOD 07/03/2008   FOLLICULITIS 01/16/2008   Seizure disorder (HCC) 08/22/2007    History reviewed. No pertinent surgical history.     Home Medications    Prior to Admission medications   Medication Sig Start Date End Date Taking? Authorizing Provider  albuterol (VENTOLIN HFA) 108 (90 Base) MCG/ACT inhaler Inhale 2 puffs into the lungs every 6 (six) hours as needed for wheezing or shortness of breath. 11/02/21  Yes Lional Icenogle, PA-C  fluticasone (FLONASE) 50 MCG/ACT nasal spray Place 1 spray into both nostrils daily. 11/19/19   Darr, Gerilyn Pilgrim, PA-C  levETIRAcetam (KEPPRA) 100 MG/ML solution TAKE 1 TEASPOONFUL( ) BY MOUTH 2 TIMES DAILY. 11/02/21   Kery Batzel, Lurena Joiner PA-C    Family History Family History  Problem Relation Age of Onset   Healthy Mother    Healthy Father    Alcohol abuse Paternal Grandfather     Social History Social History   Tobacco Use   Smoking status:  Every Day    Packs/day: 1.00    Types: Cigarettes   Smokeless tobacco: Never  Vaping Use   Vaping Use: Never used  Substance Use Topics   Alcohol use: Yes    Alcohol/week: 1.0 standard drink    Types: 1 Cans of beer per week   Drug use: No     Allergies   Chocolate flavor, Eggs or egg-derived products, and Peanut-containing drug products   Review of Systems Review of Systems As per HPI  Physical Exam Triage Vital Signs ED Triage Vitals [11/02/21 1450]  Enc Vitals Group     BP (!) 162/93     Pulse Rate (!) 102     Resp 19     Temp 98.9 F (37.2 C)     Temp Source Oral     SpO2 97 %     Weight      Height      Head Circumference      Peak Flow      Pain Score 0     Pain Loc      Pain Edu?      Excl. in GC?    No data found.  Updated Vital Signs BP (!) 140/93 (BP Location: Right Arm)   Pulse 98   Temp 98.9 F (37.2 C) (Oral)   Resp 19   SpO2  98%    Physical Exam Vitals and nursing note reviewed.  Constitutional:      General: He is not in acute distress.    Appearance: He is well-developed.  HENT:     Mouth/Throat:     Mouth: Mucous membranes are moist.     Pharynx: Oropharynx is clear.  Eyes:     Conjunctiva/sclera: Conjunctivae normal.  Cardiovascular:     Rate and Rhythm: Normal rate and regular rhythm.     Heart sounds: Normal heart sounds.  Pulmonary:     Effort: Pulmonary effort is normal.     Breath sounds: Wheezing present.     Comments: Expirational wheezes in all lung fields.  Patient does not have symptoms at this time.  No respiratory distress Abdominal:     General: Bowel sounds are normal.     Palpations: Abdomen is soft.     Tenderness: There is no abdominal tenderness.  Musculoskeletal:     Cervical back: Normal range of motion.  Lymphadenopathy:     Cervical: No cervical adenopathy.  Neurological:     General: No focal deficit present.     Mental Status: He is alert and oriented to person, place, and time.     Cranial  Nerves: No cranial nerve deficit.     Sensory: Sensation is intact.     Motor: Motor function is intact.     Coordination: Coordination is intact.     Gait: Gait is intact.     Comments: Strength 5/5 all extrem, sensation and pulses intact, full ROM     UC Treatments / Results  Labs (all labs ordered are listed, but only abnormal results are displayed) Labs Reviewed - No data to display  EKG  Radiology No results found.  Procedures Procedures (including critical care time)  Medications Ordered in UC Medications - No data to display  Initial Impression / Assessment and Plan / UC Course  I have reviewed the triage vital signs and the nursing notes.  Pertinent labs & imaging results that were available during my care of the patient were reviewed by me and considered in my medical decision making (see chart for details).   Expirational wheezing heard on exam but patient has no symptoms.  He is not short of breath. Does have a history of asthma but has not had any problems since he was a teenager.  I have sent in an albuterol inhaler for him to use if he develops any wheezing or shortness of breath.  He understands to go to the emergency department if these symptoms develop. Otherwise physical exam is reassuring.  He has no neurological defect.  I have refilled his seizure medicine to last through a week, although he does have appointment with neurology in 3 days.  I have also put him on the primary care assistance list and he understands someone will contact him regarding establishing with a primary care provider.  He does have mildly elevated blood pressure in clinic today.  States no history of hypertension.  He is asymptomatic.  Once he establishes with primary care he will follow-up with them regarding this.  Patient agrees to plan and he is discharged in stable condition.  Final Clinical Impressions(s) / UC Diagnoses   Final diagnoses:  Medication refill     Discharge  Instructions      I have refilled your seizure medicine and sent in an albuterol inhaler for you to use if you develop symptoms.  Please go to your neurology  appointment on the 7th.  Someone will be contacting you regarding setting up with a primary care provider in the area.    ED Prescriptions     Medication Sig Dispense Auth. Provider   levETIRAcetam (KEPPRA) 100 MG/ML solution TAKE 1 TEASPOONFUL( 5ML) BY MOUTH 2 TIMES DAILY. 45 mL Garreth Burnsworth, PA-C   albuterol (VENTOLIN HFA) 108 (90 Base) MCG/ACT inhaler Inhale 2 puffs into the lungs every 6 (six) hours as needed for wheezing or shortness of breath. 18 g Ivis Nicolson, Lurena Joinerebecca, PA-C      PDMP not reviewed this encounter.   Mitsuye Schrodt, Ray ChurchRebecca, PA-C 11/02/21 1538

## 2021-11-05 ENCOUNTER — Other Ambulatory Visit: Payer: Self-pay | Admitting: *Deleted

## 2021-11-05 ENCOUNTER — Encounter: Payer: Self-pay | Admitting: Neurology

## 2021-11-05 ENCOUNTER — Ambulatory Visit (INDEPENDENT_AMBULATORY_CARE_PROVIDER_SITE_OTHER): Payer: BC Managed Care – PPO | Admitting: Neurology

## 2021-11-05 VITALS — BP 132/89 | HR 102 | Ht 67.0 in | Wt 211.0 lb

## 2021-11-05 DIAGNOSIS — G40309 Generalized idiopathic epilepsy and epileptic syndromes, not intractable, without status epilepticus: Secondary | ICD-10-CM | POA: Diagnosis not present

## 2021-11-05 MED ORDER — LEVETIRACETAM 100 MG/ML PO SOLN: ORAL | 11 refills | Status: DC

## 2021-11-05 NOTE — Patient Instructions (Signed)
Continue with Keppra 500 mg twice daily Set up care with a PCP Follow-up in 1 year
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
## 2022-11-03 ENCOUNTER — Other Ambulatory Visit: Payer: Self-pay | Admitting: Neurology

## 2022-11-10 ENCOUNTER — Ambulatory Visit: Payer: BC Managed Care – PPO | Admitting: Neurology

## 2022-12-26 ENCOUNTER — Encounter (HOSPITAL_COMMUNITY): Payer: Self-pay

## 2022-12-26 ENCOUNTER — Ambulatory Visit (HOSPITAL_COMMUNITY)
Admission: EM | Admit: 2022-12-26 | Discharge: 2022-12-26 | Disposition: A | Discharge status: Home / Self Care | Payer: BC Managed Care – PPO | Attending: Physician Assistant | Admitting: Physician Assistant

## 2022-12-26 DIAGNOSIS — G40909 Epilepsy, unspecified, not intractable, without status epilepticus: Secondary | ICD-10-CM

## 2022-12-26 MED ORDER — LEVETIRACETAM 100 MG/ML PO SOLN
ORAL | 0 refills | Status: DC
Start: 2022-12-26 — End: 2023-01-26

## 2022-12-26 NOTE — ED Provider Notes (Signed)
[facility]   MRN: [id number] DOB: 11/10/1985  Subjective:   Theodore Cruz is a 37 y.o. male presenting for medication refill.  He has generalized idiopathic epilepsy and has been stable on Keppra.  He missed his annual appointment with his neurologist and is needing a refill on the Keppra.  It has been more than 8 years since his last seizure.  He feels well today.  Denies any headache or dizziness.  No neurologic issues.  No chest pain or shortness of breath.  He just ran out of his medication today.  No current facility-administered medications for this encounter.  Current Outpatient Medications:    albuterol (VENTOLIN HFA) 108 (90 Base) MCG/ACT inhaler, Inhale 2 puffs into the lungs every 6 (six) hours as needed for wheezing or shortness of breath., Disp: 18 g, Rfl: 2   fluticasone (FLONASE) 50 MCG/ACT nasal spray, Place 1 spray into both nostrils daily., Disp: 11.1 mL, Rfl: 0   levETIRAcetam (KEPPRA) 100 MG/ML solution, TAKE 5 ML BY MOUTH  TWICE DAILY, Disp: 300 mL, Rfl: 0   Allergies  Allergen Reactions   Chocolate Flavor Shortness Of Breath and Swelling   Egg-Derived Products Shortness Of Breath and Swelling   Peanut-Containing Drug Products Shortness Of Breath and Swelling    Past Medical History:  Diagnosis Date   Asthma    Seizures (HCC)      History reviewed. No pertinent surgical history.  Family History  Problem Relation Age of Onset   Healthy Mother    Healthy Father    Alcohol abuse Paternal Grandfather     Social History   Tobacco Use   Smoking status: Every Day    Current packs/day: 0.50    Types: Cigarettes   Smokeless tobacco: Never  Vaping Use   Vaping status: Never Used  Substance Use Topics   Alcohol use: Yes    Alcohol/week: 1.0 standard drink of alcohol    Types: 1 Cans of beer per week   Drug use: No    ROS REFER TO HPI FOR PERTINENT POSITIVES AND NEGATIVES   Objective:   Vitals: BP 135/86 (BP Location: Left Arm)    Pulse 91   Temp 98.3 F (36.8 C) (Oral)   Resp 16   SpO2 97%   Physical Exam Vitals and nursing note reviewed.  Constitutional:      Appearance: Normal appearance.  Eyes:     Extraocular Movements: Extraocular movements intact.     Conjunctiva/sclera: Conjunctivae normal.     Pupils: Pupils are equal, round, and reactive to light.  Cardiovascular:     Rate and Rhythm: Normal rate and regular rhythm.     Pulses: Normal pulses.     Heart sounds: No murmur heard. Pulmonary:     Effort: Pulmonary effort is normal.     Breath sounds: Normal breath sounds.  Neurological:     General: No focal deficit present.     Mental Status: He is alert and oriented to person, place, and time.     Cranial Nerves: No cranial nerve deficit.     Motor: No weakness.     Gait: Gait normal.  Psychiatric:        Mood and Affect: Mood normal.        Behavior: Behavior normal.     No results found for this or any previous visit (from the past 24 hour(s)).  Assessment and Plan :   PDMP not reviewed this encounter.  1. Seizure disorder (  HCC)    37 year old African-American male with a known seizure disorder, currently stable on Keppra 500 mg twice daily.  Refilled this prescription x 30 days.  He will call his neurologist to schedule follow-up. Patient has no other questions or concerns today.  He is OK to drive as it has been longer than 8 years since his last seizure.    AllwardtCrist Infante, PA-C 12/26/22 1344

## 2022-12-26 NOTE — ED Triage Notes (Signed)
Patient states that he missed his scheduled visit to his PCP and needing a refill on Levetiracetam.  Patient states he was told several months for a reschedule.

## 2022-12-26 NOTE — Discharge Instructions (Signed)
Your medication has been refilled.  Please take as directed.  Call your neurologist to schedule a follow-up.

## 2023-01-26 ENCOUNTER — Ambulatory Visit (INDEPENDENT_AMBULATORY_CARE_PROVIDER_SITE_OTHER): Payer: BC Managed Care – PPO | Admitting: Neurology

## 2023-01-26 ENCOUNTER — Encounter: Payer: Self-pay | Admitting: Neurology

## 2023-01-26 VITALS — BP 126/78 | Ht 67.0 in | Wt 199.0 lb

## 2023-01-26 DIAGNOSIS — G40309 Generalized idiopathic epilepsy and epileptic syndromes, not intractable, without status epilepticus: Secondary | ICD-10-CM

## 2023-01-26 MED ORDER — LEVETIRACETAM 100 MG/ML PO SOLN: ORAL | 12 refills | Status: DC

## 2023-01-26 NOTE — Patient Instructions (Signed)
Continue with Keppra 500 mg twice daily, refills given  Continue your other medications Follow-up in 1 year or sooner if worse.
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
## 2024-01-04 ENCOUNTER — Encounter: Payer: Self-pay | Admitting: Adult Health

## 2024-01-04 ENCOUNTER — Ambulatory Visit: Admitting: Adult Health

## 2024-01-25 ENCOUNTER — Ambulatory Visit: Payer: BC Managed Care – PPO | Admitting: Adult Health

## 2024-02-21 ENCOUNTER — Other Ambulatory Visit: Payer: Self-pay | Admitting: Neurology

## 2024-03-18 ENCOUNTER — Other Ambulatory Visit: Payer: Self-pay | Admitting: Neurology

## 2024-03-20 NOTE — Telephone Encounter (Signed)
Called and LVM for pt to call back and schedule an appt.

## 2024-03-22 NOTE — Telephone Encounter (Signed)
 Last seen on 01/26/23 No follow up scheduled    Phone room please call pt again to schedule a visit so we can continue to refill his seizure medication

## 2024-03-23 NOTE — Telephone Encounter (Signed)
 Pt scheduled on 04/25/24

## 2024-04-15 ENCOUNTER — Other Ambulatory Visit: Payer: Self-pay | Admitting: Neurology

## 2024-04-17 NOTE — Telephone Encounter (Signed)
 Last seen on 01/26/23 Follow up scheduled on 04/25/24

## 2024-04-25 ENCOUNTER — Ambulatory Visit (INDEPENDENT_AMBULATORY_CARE_PROVIDER_SITE_OTHER): Admitting: Adult Health

## 2024-04-25 ENCOUNTER — Other Ambulatory Visit: Payer: Self-pay

## 2024-04-25 ENCOUNTER — Encounter: Payer: Self-pay | Admitting: Adult Health

## 2024-04-25 VITALS — BP 149/82 | HR 91 | Ht 67.0 in | Wt 214.8 lb

## 2024-04-25 DIAGNOSIS — Z79899 Other long term (current) drug therapy: Secondary | ICD-10-CM

## 2024-04-25 DIAGNOSIS — R200 Anesthesia of skin: Secondary | ICD-10-CM | POA: Diagnosis not present

## 2024-04-25 DIAGNOSIS — G40309 Generalized idiopathic epilepsy and epileptic syndromes, not intractable, without status epilepticus: Secondary | ICD-10-CM | POA: Diagnosis not present

## 2024-04-25 DIAGNOSIS — G5712 Meralgia paresthetica, left lower limb: Secondary | ICD-10-CM

## 2024-04-25 DIAGNOSIS — E538 Deficiency of other specified B group vitamins: Secondary | ICD-10-CM

## 2024-04-25 MED ORDER — LEVETIRACETAM 100 MG/ML PO SOLN: 500.0000 mg | Freq: Two times a day (BID) | ORAL | 3 refills | Status: AC

## 2024-04-25 NOTE — Patient Instructions (Addendum)
 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 Will start with conservative measures to help with symptoms such as avoidance of tight clothing, use of belts and avoidance of activities that worsen symptoms. If this persists or worsens, please let me know. Will also follow up with Dr. Gregg to ensure no further work up needs to be completed currently. We will call you if he recommends doing anything further.   We will check lab work today - you will be called once results are reviewed     Follow up in 1 year or call earlier if needed    Thank you for coming to see us  at [facility]. I hope we have been able to provide you high quality care today.  You may receive a patient satisfaction survey over the next few weeks. We would appreciate your feedback and comments so that we may continue to improve ourselves and the health of our patients.

## 2024-04-25 NOTE — Addendum Note (Signed)
 Addended by: WHITFIELD RAISIN L on: 04/25/2024 11:52 AM   Modules accepted: Orders
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
## 2024-04-26 ENCOUNTER — Ambulatory Visit: Payer: Self-pay | Admitting: Adult Health

## 2024-04-26 LAB — COMPREHENSIVE METABOLIC PANEL WITH GFR
ALT: 47 IU/L — ABNORMAL HIGH (ref 0–44)
AST: 30 IU/L (ref 0–40)
Albumin: 4.6 g/dL (ref 4.1–5.1)
Alkaline Phosphatase: 105 IU/L (ref 47–123)
BUN/Creatinine Ratio: 13 (ref 9–20)
BUN: 12 mg/dL (ref 6–24)
Bilirubin Total: 0.4 mg/dL (ref 0.0–1.2)
CO2: 24 mmol/L (ref 20–29)
Calcium: 10.2 mg/dL (ref 8.7–10.2)
Chloride: 98 mmol/L (ref 96–106)
Creatinine, Ser: 0.96 mg/dL (ref 0.76–1.27)
Globulin, Total: 3 g/dL (ref 1.5–4.5)
Glucose: 88 mg/dL (ref 70–99)
Potassium: 4.6 mmol/L (ref 3.5–5.2)
Sodium: 139 mmol/L (ref 134–144)
Total Protein: 7.6 g/dL (ref 6.0–8.5)
eGFR: 102 mL/min/1.73 (ref >59)

## 2024-04-26 LAB — TSH: TSH: 2.34 u[IU]/mL (ref 0.450–4.500)

## 2024-04-26 LAB — CBC
Hematocrit: 51.1 % — ABNORMAL HIGH (ref 37.5–51.0)
Hemoglobin: 16.9 g/dL (ref 13.0–17.7)
MCH: 30.2 pg (ref 26.6–33.0)
MCHC: 33.1 g/dL (ref 31.5–35.7)
MCV: 91 fL (ref 79–97)
Platelets: 382 x10E3/uL (ref 150–450)
RBC: 5.6 x10E6/uL (ref 4.14–5.80)
RDW: 12.3 % (ref 11.6–15.4)
WBC: 7.1 x10E3/uL (ref 3.4–10.8)

## 2024-04-26 LAB — LEVETIRACETAM LEVEL: Levetiracetam Lvl: 18.3 ug/mL (ref 10.0–40.0)

## 2024-04-26 LAB — VITAMIN B12: Vitamin B-12: 738 pg/mL (ref 232–1245)

## 2024-04-26 NOTE — Telephone Encounter (Signed)
-----   Message from Harlene Bogaert sent at 04/26/2024 11:49 AM EST ----- Please advise patient that recent Keppra  level was satisfactory and to continue on current dosage. ----- Message ----- From: Rebecka Memos Lab Results In Sent: 04/26/2024  10:36 AM EST To: Harlene Bogaert, NP

## 2024-04-26 NOTE — Telephone Encounter (Signed)
 Called patient and discussed his lab results and recommendations below as noted by Harlene NP. He verbalized understanding and appreciation and did not have any questions at the time of the call.

## 2025-05-03 ENCOUNTER — Ambulatory Visit: Admitting: Adult Health
